# Patient Record
Sex: Female | Born: 1980 | Race: White | Hispanic: No | Marital: Married | Smoking: Never smoker
Health system: Southern US, Community
[De-identification: ages and names within clinical notes are randomized; demographics above are authoritative.]

## PROBLEM LIST (undated history)

## (undated) DIAGNOSIS — G43909 Migraine, unspecified, not intractable, without status migrainosus: Secondary | ICD-10-CM

## (undated) HISTORY — PX: TUBAL LIGATION: SHX77

---
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
## 2004-07-21 ENCOUNTER — Emergency Department: Payer: Self-pay | Admitting: Emergency Medicine

## 2004-12-01 ENCOUNTER — Emergency Department (HOSPITAL_COMMUNITY): Admission: EM | Admit: 2004-12-01 | Discharge: 2004-12-02 | Payer: Self-pay | Admitting: Emergency Medicine

## 2004-12-12 ENCOUNTER — Inpatient Hospital Stay (HOSPITAL_COMMUNITY): Admission: AD | Admit: 2004-12-12 | Discharge: 2004-12-12 | Payer: Self-pay | Admitting: *Deleted

## 2005-02-15 ENCOUNTER — Ambulatory Visit (HOSPITAL_COMMUNITY): Admission: RE | Admit: 2005-02-15 | Discharge: 2005-02-15 | Payer: Self-pay | Admitting: *Deleted

## 2005-03-19 ENCOUNTER — Ambulatory Visit (HOSPITAL_COMMUNITY): Admission: RE | Admit: 2005-03-19 | Discharge: 2005-03-19 | Payer: Self-pay | Admitting: Obstetrics and Gynecology

## 2005-04-24 ENCOUNTER — Inpatient Hospital Stay (HOSPITAL_COMMUNITY): Admission: AD | Admit: 2005-04-24 | Discharge: 2005-04-24 | Payer: Self-pay | Admitting: Obstetrics and Gynecology

## 2005-04-24 ENCOUNTER — Ambulatory Visit: Payer: Self-pay | Admitting: *Deleted

## 2005-05-30 ENCOUNTER — Ambulatory Visit: Payer: Self-pay | Admitting: *Deleted

## 2005-05-30 ENCOUNTER — Inpatient Hospital Stay (HOSPITAL_COMMUNITY): Admission: AD | Admit: 2005-05-30 | Discharge: 2005-05-31 | Payer: Self-pay | Admitting: Family Medicine

## 2005-07-01 ENCOUNTER — Ambulatory Visit: Payer: Self-pay | Admitting: Certified Nurse Midwife

## 2005-07-01 ENCOUNTER — Inpatient Hospital Stay (HOSPITAL_COMMUNITY): Admission: AD | Admit: 2005-07-01 | Discharge: 2005-07-02 | Payer: Self-pay | Admitting: *Deleted

## 2005-07-03 ENCOUNTER — Inpatient Hospital Stay (HOSPITAL_COMMUNITY): Admission: AD | Admit: 2005-07-03 | Discharge: 2005-07-03 | Payer: Self-pay | Admitting: Anesthesiology

## 2005-07-03 ENCOUNTER — Ambulatory Visit: Payer: Self-pay | Admitting: *Deleted

## 2005-07-05 ENCOUNTER — Ambulatory Visit: Payer: Self-pay | Admitting: *Deleted

## 2005-07-05 ENCOUNTER — Ambulatory Visit (HOSPITAL_COMMUNITY): Admission: RE | Admit: 2005-07-05 | Discharge: 2005-07-05 | Payer: Self-pay | Admitting: *Deleted

## 2005-07-07 ENCOUNTER — Ambulatory Visit: Payer: Self-pay | Admitting: *Deleted

## 2005-07-07 ENCOUNTER — Inpatient Hospital Stay (HOSPITAL_COMMUNITY): Admission: AD | Admit: 2005-07-07 | Discharge: 2005-07-07 | Payer: Self-pay | Admitting: Family Medicine

## 2005-07-12 ENCOUNTER — Inpatient Hospital Stay (HOSPITAL_COMMUNITY): Admission: AD | Admit: 2005-07-12 | Discharge: 2005-07-16 | Payer: Self-pay | Admitting: *Deleted

## 2005-07-12 ENCOUNTER — Ambulatory Visit: Payer: Self-pay | Admitting: *Deleted

## 2006-12-04 ENCOUNTER — Emergency Department (HOSPITAL_COMMUNITY): Admission: EM | Admit: 2006-12-04 | Discharge: 2006-12-04 | Payer: Self-pay | Admitting: Family Medicine

## 2009-04-10 ENCOUNTER — Inpatient Hospital Stay (HOSPITAL_COMMUNITY): Admission: AD | Admit: 2009-04-10 | Discharge: 2009-04-10 | Payer: Self-pay | Admitting: Obstetrics and Gynecology

## 2009-04-18 ENCOUNTER — Other Ambulatory Visit: Payer: Self-pay | Admitting: Obstetrics & Gynecology

## 2009-04-18 ENCOUNTER — Inpatient Hospital Stay (HOSPITAL_COMMUNITY): Admission: RE | Admit: 2009-04-18 | Discharge: 2009-04-20 | Payer: Self-pay | Admitting: Obstetrics & Gynecology

## 2009-05-29 ENCOUNTER — Ambulatory Visit (HOSPITAL_COMMUNITY): Admission: RE | Admit: 2009-05-29 | Discharge: 2009-05-29 | Payer: Self-pay | Admitting: Obstetrics & Gynecology

## 2011-01-12 LAB — CBC
HCT: 39.7 % (ref 36.0–46.0)
Hemoglobin: 13.8 g/dL (ref 12.0–15.0)
MCHC: 34.6 g/dL (ref 30.0–36.0)
MCV: 89.5 fL (ref 78.0–100.0)
Platelets: 205 10*3/uL (ref 150–400)
RBC: 4.44 MIL/uL (ref 3.87–5.11)
RDW: 13 % (ref 11.5–15.5)
WBC: 7.3 10*3/uL (ref 4.0–10.5)

## 2011-01-12 LAB — PREGNANCY, URINE: Preg Test, Ur: NEGATIVE

## 2011-01-13 LAB — CBC
HCT: 35.8 % — ABNORMAL LOW (ref 36.0–46.0)
HCT: 39.2 % (ref 36.0–46.0)
Hemoglobin: 12.4 g/dL (ref 12.0–15.0)
Hemoglobin: 13.8 g/dL (ref 12.0–15.0)
MCHC: 34.5 g/dL (ref 30.0–36.0)
MCHC: 35.2 g/dL (ref 30.0–36.0)
MCV: 91.3 fL (ref 78.0–100.0)
MCV: 91.5 fL (ref 78.0–100.0)
Platelets: 130 10*3/uL — ABNORMAL LOW (ref 150–400)
Platelets: 131 10*3/uL — ABNORMAL LOW (ref 150–400)
RBC: 3.91 MIL/uL (ref 3.87–5.11)
RBC: 4.3 MIL/uL (ref 3.87–5.11)
RDW: 14.3 % (ref 11.5–15.5)
RDW: 14.8 % (ref 11.5–15.5)
WBC: 8.1 10*3/uL (ref 4.0–10.5)
WBC: 9.1 10*3/uL (ref 4.0–10.5)

## 2011-01-13 LAB — URINE CULTURE: Colony Count: 35000

## 2011-01-13 LAB — URINALYSIS, ROUTINE W REFLEX MICROSCOPIC
Bilirubin Urine: NEGATIVE
Glucose, UA: NEGATIVE mg/dL
Hgb urine dipstick: NEGATIVE
Ketones, ur: 15 mg/dL — AB
Nitrite: NEGATIVE
Protein, ur: NEGATIVE mg/dL
Specific Gravity, Urine: 1.02 (ref 1.005–1.030)
Urobilinogen, UA: 0.2 mg/dL (ref 0.0–1.0)
pH: 5.5 (ref 5.0–8.0)

## 2011-01-13 LAB — CCBB MATERNAL DONOR DRAW

## 2011-01-13 LAB — RPR: RPR Ser Ql: NONREACTIVE

## 2011-02-19 NOTE — Op Note (Signed)
NAMEDANNELL, RACZKOWSKI NO.:  [id number]   MEDICAL RECORD NO.:  [id number]          PATIENT TYPE:  AMB   LOCATION:  SDC                           FACILITY:  [facility]   PHYSICIAN:  Ilda Mori, M.D.   DATE OF BIRTH:  05/28/1981   DATE OF PROCEDURE:  05/29/2009  DATE OF DISCHARGE:                               OPERATIVE REPORT   PREOPERATIVE DIAGNOSIS:  Voluntary sterilization.   POSTOPERATIVE DIAGNOSIS:  Voluntary sterilization.   PROCEDURE:  Laparoscopic-bilateral tubal cautery for sterilization.   SURGEON:  Ilda Mori, MD   ANESTHESIA:  General endotracheal.   ESTIMATED BLOOD LOSS:  Minimal.   FINDINGS:  The uterus was normal size, shape, and contour on palpation  and on laparoscopy.  The adnexa were normal without adhesions or  inflammation.  The upper abdomen was explored and it was normal.  The  appendix appeared normal.   INDICATIONS:  This is a 30 year old gravida 2, para 2 female who is 6  weeks status post spontaneous vaginal delivery, who requested permanent  sterilization, failure rate of 2:1000 was discussed with the patient as  well as the fact that sterilization was a permanent procedure in  addition, alternative forms of non-permanent birth control was discussed  with the patient prior to proceeding.   DESCRIPTION OF PROCEDURE:  The patient was taken to the operating and  placed in a supine position.  General endotracheal anesthesia was  induced.  She was then placed in dorsal lithotomy position.  The  abdomen, perineum, and vagina were prepped and draped in sterile  fashion.  The bladder was catheterized.  A Hulka tenaculum was placed  through the endocervical canal and affixed the anterior lip of the  cervix.  Surgeon regowned and gloved.  An incision was made in the umbilicus and  suprapubic area.  A Veress needle was introduced into the peritoneal  cavity and pneumoperitoneum was created.  A 5-mm trocar was introduced  and a 5-mm  laparoscope was placed.  Under direct visualization,  accessory instruments were placed through a suprapubic stab wound  through a 5-mm port.  The Kleppinger forceps were then placed through  the accessory port and the left fallopian tube was grasped at the  isthmic ampullary junction and cauterized along a 4-cm length.  The  identical procedure was then carried out on the contralateral tube.  At  this point, the procedure was terminated.  The  instruments were removed.  The gas was allowed to escape.  The trocars  were removed and the incisions were closed with Dermabond adhesive.  The  patient tolerated the procedure well and left the operating room in good  condition.      Ilda Mori, M.D.  Electronically Signed     RK/MEDQ  D:  05/29/2009  T:  05/29/2009  Job:  [id number]

## 2012-03-27 ENCOUNTER — Encounter (INDEPENDENT_AMBULATORY_CARE_PROVIDER_SITE_OTHER): Payer: BC Managed Care – PPO | Admitting: Ophthalmology

## 2012-03-27 DIAGNOSIS — H33109 Unspecified retinoschisis, unspecified eye: Secondary | ICD-10-CM

## 2012-03-27 DIAGNOSIS — H209 Unspecified iridocyclitis: Secondary | ICD-10-CM

## 2012-03-27 DIAGNOSIS — H53009 Unspecified amblyopia, unspecified eye: Secondary | ICD-10-CM

## 2012-03-27 DIAGNOSIS — H31009 Unspecified chorioretinal scars, unspecified eye: Secondary | ICD-10-CM

## 2012-03-27 DIAGNOSIS — H25049 Posterior subcapsular polar age-related cataract, unspecified eye: Secondary | ICD-10-CM

## 2012-03-27 DIAGNOSIS — H43819 Vitreous degeneration, unspecified eye: Secondary | ICD-10-CM

## 2012-05-05 ENCOUNTER — Encounter (INDEPENDENT_AMBULATORY_CARE_PROVIDER_SITE_OTHER): Payer: BC Managed Care – PPO | Admitting: Ophthalmology

## 2012-05-05 DIAGNOSIS — H53009 Unspecified amblyopia, unspecified eye: Secondary | ICD-10-CM

## 2012-05-05 DIAGNOSIS — H43819 Vitreous degeneration, unspecified eye: Secondary | ICD-10-CM

## 2012-05-05 DIAGNOSIS — H25049 Posterior subcapsular polar age-related cataract, unspecified eye: Secondary | ICD-10-CM

## 2012-05-05 DIAGNOSIS — H33109 Unspecified retinoschisis, unspecified eye: Secondary | ICD-10-CM

## 2012-08-10 ENCOUNTER — Encounter (INDEPENDENT_AMBULATORY_CARE_PROVIDER_SITE_OTHER): Payer: BC Managed Care – PPO | Admitting: Ophthalmology

## 2014-12-06 ENCOUNTER — Encounter (HOSPITAL_COMMUNITY): Payer: Self-pay | Admitting: Emergency Medicine

## 2014-12-06 ENCOUNTER — Emergency Department (HOSPITAL_COMMUNITY): Payer: Self-pay

## 2014-12-06 ENCOUNTER — Emergency Department (HOSPITAL_COMMUNITY)
Admission: EM | Admit: 2014-12-06 | Discharge: 2014-12-06 | Disposition: A | Discharge status: Home / Self Care | Payer: Self-pay | Attending: Emergency Medicine | Admitting: Emergency Medicine

## 2014-12-06 DIAGNOSIS — R112 Nausea with vomiting, unspecified: Secondary | ICD-10-CM | POA: Insufficient documentation

## 2014-12-06 DIAGNOSIS — R519 Headache, unspecified: Secondary | ICD-10-CM

## 2014-12-06 DIAGNOSIS — R51 Headache: Secondary | ICD-10-CM | POA: Insufficient documentation

## 2014-12-06 DIAGNOSIS — M542 Cervicalgia: Secondary | ICD-10-CM | POA: Insufficient documentation

## 2014-12-06 DIAGNOSIS — H53149 Visual discomfort, unspecified: Secondary | ICD-10-CM | POA: Insufficient documentation

## 2014-12-06 DIAGNOSIS — Z8679 Personal history of other diseases of the circulatory system: Secondary | ICD-10-CM | POA: Insufficient documentation

## 2014-12-06 DIAGNOSIS — R42 Dizziness and giddiness: Secondary | ICD-10-CM | POA: Insufficient documentation

## 2014-12-06 DIAGNOSIS — Z3202 Encounter for pregnancy test, result negative: Secondary | ICD-10-CM | POA: Insufficient documentation

## 2014-12-06 DIAGNOSIS — Z880 Allergy status to penicillin: Secondary | ICD-10-CM | POA: Insufficient documentation

## 2014-12-06 HISTORY — DX: Migraine, unspecified, not intractable, without status migrainosus: G43.909

## 2014-12-06 LAB — URINALYSIS, ROUTINE W REFLEX MICROSCOPIC
Bilirubin Urine: NEGATIVE
Glucose, UA: NEGATIVE mg/dL
Hgb urine dipstick: NEGATIVE
Ketones, ur: 15 mg/dL — AB
Leukocytes, UA: NEGATIVE
Nitrite: NEGATIVE
Protein, ur: NEGATIVE mg/dL
Specific Gravity, Urine: 1.014 (ref 1.005–1.030)
Urobilinogen, UA: 0.2 mg/dL (ref 0.0–1.0)
pH: 7 (ref 5.0–8.0)

## 2014-12-06 LAB — I-STAT CHEM 8, ED
BUN: 6 mg/dL (ref 6–23)
Calcium, Ion: 1.13 mmol/L (ref 1.12–1.23)
Chloride: 102 mmol/L (ref 96–112)
Creatinine, Ser: 0.9 mg/dL (ref 0.50–1.10)
Glucose, Bld: 127 mg/dL — ABNORMAL HIGH (ref 70–99)
HCT: 43 % (ref 36.0–46.0)
Hemoglobin: 14.6 g/dL (ref 12.0–15.0)
Potassium: 3.4 mmol/L — ABNORMAL LOW (ref 3.5–5.1)
Sodium: 140 mmol/L (ref 135–145)
TCO2: 22 mmol/L (ref 0–100)

## 2014-12-06 LAB — BASIC METABOLIC PANEL
Anion gap: 8 (ref 5–15)
BUN: 6 mg/dL (ref 6–23)
CO2: 27 mmol/L (ref 19–32)
Calcium: 8.9 mg/dL (ref 8.4–10.5)
Chloride: 104 mmol/L (ref 96–112)
Creatinine, Ser: 0.89 mg/dL (ref 0.50–1.10)
GFR calc Af Amer: >90 mL/min (ref >90)
GFR calc non Af Amer: 85 mL/min — ABNORMAL LOW (ref >90)
Glucose, Bld: 131 mg/dL — ABNORMAL HIGH (ref 70–99)
Potassium: 3.5 mmol/L (ref 3.5–5.1)
Sodium: 139 mmol/L (ref 135–145)

## 2014-12-06 LAB — DIFFERENTIAL
Basophils Absolute: 0 10*3/uL (ref 0.0–0.1)
Basophils Relative: 0 % (ref 0–1)
Eosinophils Absolute: 0.1 10*3/uL (ref 0.0–0.7)
Eosinophils Relative: 1 % (ref 0–5)
Lymphocytes Relative: 13 % (ref 12–46)
Lymphs Abs: 1.6 10*3/uL (ref 0.7–4.0)
Monocytes Absolute: 0.4 10*3/uL (ref 0.1–1.0)
Monocytes Relative: 4 % (ref 3–12)
Neutro Abs: 10 10*3/uL — ABNORMAL HIGH (ref 1.7–7.7)
Neutrophils Relative %: 82 % — ABNORMAL HIGH (ref 43–77)

## 2014-12-06 LAB — CBC
HCT: 41.1 % (ref 36.0–46.0)
Hemoglobin: 14.3 g/dL (ref 12.0–15.0)
MCH: 29.7 pg (ref 26.0–34.0)
MCHC: 34.8 g/dL (ref 30.0–36.0)
MCV: 85.4 fL (ref 78.0–100.0)
Platelets: 250 10*3/uL (ref 150–400)
RBC: 4.81 MIL/uL (ref 3.87–5.11)
RDW: 13.3 % (ref 11.5–15.5)
WBC: 12.4 10*3/uL — ABNORMAL HIGH (ref 4.0–10.5)

## 2014-12-06 LAB — POC URINE PREG, ED: Preg Test, Ur: NEGATIVE

## 2014-12-06 MED ORDER — SODIUM CHLORIDE 0.9 % IV BOLUS (SEPSIS)
1000.0000 mL | Freq: Once | INTRAVENOUS | Status: AC
  Administered 2014-12-06: 1000 mL via INTRAVENOUS

## 2014-12-06 MED ORDER — DIPHENHYDRAMINE HCL 50 MG/ML IJ SOLN
25.0000 mg | Freq: Once | INTRAMUSCULAR | Status: AC
  Administered 2014-12-06: 25 mg via INTRAVENOUS
  Filled 2014-12-06: qty 1

## 2014-12-06 MED ORDER — NAPROXEN 500 MG PO TABS: 500.0000 mg | ORAL_TABLET | Freq: Two times a day (BID) | ORAL | Status: DC | PRN

## 2014-12-06 MED ORDER — METOCLOPRAMIDE HCL 5 MG/ML IJ SOLN
10.0000 mg | Freq: Once | INTRAMUSCULAR | Status: AC
  Administered 2014-12-06: 10 mg via INTRAVENOUS
  Filled 2014-12-06: qty 2

## 2014-12-06 MED ORDER — ONDANSETRON 4 MG PO TBDP
8.0000 mg | ORAL_TABLET | Freq: Once | ORAL | Status: AC
  Administered 2014-12-06: 8 mg via ORAL

## 2014-12-06 MED ORDER — MORPHINE SULFATE 4 MG/ML IJ SOLN
4.0000 mg | Freq: Once | INTRAMUSCULAR | Status: AC
  Administered 2014-12-06: 4 mg via INTRAVENOUS
  Filled 2014-12-06: qty 1

## 2014-12-06 MED ORDER — METOCLOPRAMIDE HCL 10 MG PO TABS: 10.0000 mg | ORAL_TABLET | Freq: Four times a day (QID) | ORAL | Status: AC | PRN

## 2014-12-06 MED ORDER — DIPHENHYDRAMINE HCL 25 MG PO TABS: 25.0000 mg | ORAL_TABLET | Freq: Four times a day (QID) | ORAL | Status: AC | PRN

## 2014-12-06 NOTE — ED Notes (Signed)
Ct called to report patient is ready, Annabelle HarmanDana acknowledges.

## 2014-12-06 NOTE — ED Notes (Signed)
Patient hesitant to remove ear piercings for CT scan, called CT tech to report and she came down to review.

## 2014-12-06 NOTE — ED Notes (Addendum)
Patient with sharp pains in right side of head.  Patient states she has been vomiting with the pain.  Patient states that the pains come in waves and continues on.  Patient very tearful in triage.  Patient states that she does have some neck pain from clenching when the pain comes.  Patient continues with the nausea, which comes in waves with the headache.  Patient states that this pain is nothing like her migraines, much worse.

## 2014-12-06 NOTE — ED Notes (Signed)
Patient given a ginger ale as PO challenge.

## 2014-12-06 NOTE — ED Notes (Signed)
France RavensMercedes, Pa-C, at the bedside.

## 2014-12-06 NOTE — ED Provider Notes (Signed)
CSN: [id number]     Arrival date & time 12/06/14  1924 History   First MD Initiated Contact with Patient 12/06/14 1958     Chief Complaint  Patient presents with  . Headache  . Emesis     (Consider location/radiation/quality/duration/timing/severity/associated sxs/prior Treatment) HPI Comments: Shelby Ward is a 34 y.o. female with a PMHx of migraines, who presents to the ED with complaints of sudden onset right-sided headache that began at 5:30 PM. She reports the pain is 10/10 throbbing with intermittent stabbing pain, constant but waxing and waning in severity, which intermittently radiates to her neck and over to the left side of her head, worse with lights and sounds, and unrelieved with Advil due to her throwing up prior to arrival. She endorses associated nausea and vomiting with 7 episodes of nonbloody nonbilious emesis prior to arrival (threw up zofran given in triage), as well as photophobia and lightheadedness with standing. She reports that this headache is somewhat similar to her migraines except that the location is slightly different and she usually has preceding symptoms to signal she is getting a headache. She reports that this is not the worse headache of her life she has had more severe headaches in the past. She reports that last week she had a URI and is having some residual sinus congestion and clear rhinorrhea but these are improving since last week. She denies any fevers, chills, chest pain, shortness breath, ear pain or drainage, tinnitus, hearing loss, sore throat, abdominal pain, diarrhea, constipation, speech, melena, hematochezia, dysuria, hematuria, vaginal bleeding or discharge, numbness tingling, weakness, dizziness, vision changes, syncope, neck stiffness, or rashes.  Last menstrual period was 11/26/14. Got tragus piercing last week.  Patient is a 34 y.o. female presenting with headaches and vomiting. The history is provided by the patient. No language interpreter was  used.  Headache Pain location:  R temporal Quality:  Stabbing (throbbing and occasionally stabbing) Radiates to:  L neck and R neck (and generalized head) Severity currently:  10/10 Severity at highest:  10/10 Onset quality:  Sudden Duration:  3 hours Timing:  Constant Progression:  Waxing and waning Chronicity:  Recurrent Similar to prior headaches: yes (similar in the way it feels, different in location, not the worse of her life)   Context: bright light and loud noise   Relieved by:  Nothing Worsened by:  Light and sound Ineffective treatments:  NSAIDs (advil attempted but vomited it up) Associated symptoms: nausea, neck pain, photophobia, sinus pressure (resolving URI from last week) and vomiting   Associated symptoms: no abdominal pain, no back pain, no blurred vision, no congestion, no cough, no diarrhea, no dizziness, no ear pain, no eye pain, no facial pain, no fever, no focal weakness, no hearing loss, no loss of balance, no myalgias, no near-syncope, no neck stiffness, no numbness, no paresthesias, no sore throat, no syncope, no tingling, no URI, no visual change and no weakness   Emesis Associated symptoms: headaches   Associated symptoms: no abdominal pain, no arthralgias, no chills, no diarrhea, no myalgias, no sore throat and no URI     Past Medical History  Diagnosis Date  . Migraine    Past Surgical History  Procedure Laterality Date  . Tubal ligation     History reviewed. No pertinent family history. History  Substance Use Topics  . Smoking status: Never Smoker   . Smokeless tobacco: Not on file  . Alcohol Use: No   OB History    No data available  Review of Systems  Constitutional: Negative for fever and chills.  HENT: Positive for rhinorrhea (clear) and sinus pressure (resolving URI from last week). Negative for congestion, ear discharge, ear pain, hearing loss, sore throat and tinnitus.   Eyes: Positive for photophobia. Negative for blurred vision,  pain, discharge and visual disturbance.  Respiratory: Negative for cough and shortness of breath.   Cardiovascular: Negative for chest pain, syncope and near-syncope.  Gastrointestinal: Positive for nausea and vomiting. Negative for abdominal pain, diarrhea, constipation and blood in stool.  Genitourinary: Negative for dysuria, frequency, hematuria, flank pain, vaginal bleeding and vaginal discharge.  Musculoskeletal: Positive for neck pain. Negative for myalgias, back pain, arthralgias and neck stiffness.  Skin: Negative for rash.  Neurological: Positive for light-headedness (with standing) and headaches. Negative for dizziness, focal weakness, syncope, facial asymmetry, weakness, numbness, paresthesias and loss of balance.  Psychiatric/Behavioral: Negative for confusion.   10 Systems reviewed and are negative for acute change except as noted in the HPI.    Allergies  Penicillins  Home Medications   Prior to Admission medications   Not on File   BP 152/103 mmHg  Pulse 64  Temp(Src) 98.2 F (36.8 C) (Oral)  Resp 20  Ht 5\' 9"  (1.753 m)  Wt 280 lb (127.007 kg)  BMI 41.33 kg/m2  SpO2 96%  LMP 11/26/2014 (Approximate) Physical Exam  Constitutional: She is oriented to person, place, and time. Vital signs are normal. She appears well-developed and well-nourished.  Non-toxic appearance. She appears distressed.  Afebrile, nontoxic, tearful and slightly anxious, appears uncomfortable. BP 152/103 likely from pt discomfort since it came down to 122/96 upon recheck  HENT:  Head: Normocephalic and atraumatic.  Right Ear: Hearing, tympanic membrane, external ear and ear canal normal.  Left Ear: Hearing, tympanic membrane, external ear and ear canal normal.  Nose: Mucosal edema (very mild) and rhinorrhea (clear) present. Right sinus exhibits maxillary sinus tenderness. Right sinus exhibits no frontal sinus tenderness. Left sinus exhibits maxillary sinus tenderness. Left sinus exhibits no  frontal sinus tenderness.  Mouth/Throat: Uvula is midline, oropharynx is clear and moist and mucous membranes are normal.  Slight mucosal edema of b/l nasal turbinates with clear rhinorrhea and b/l maxillary sinus TTP Oropharynx clear Ears clear /AT  Eyes: Conjunctivae and EOM are normal. Pupils are equal, round, and reactive to light. Right eye exhibits no discharge. Left eye exhibits no discharge.  PERRL, EOMI, no nystagmus, no visual deficits  Neck: Normal range of motion. Neck supple. Muscular tenderness present. No spinous process tenderness present. No rigidity. Normal range of motion present.    FROM intact without spinous process TTP, no bony stepoffs or deformities, slight b/l paraspinous muscle tenderness with some muscle spasms although pt reports that palpation of the area "feels good". No rigidity or meningeal signs.  Cardiovascular: Normal rate, regular rhythm, normal heart sounds and intact distal pulses.  Exam reveals no gallop and no friction rub.   No murmur heard. Pulmonary/Chest: Effort normal and breath sounds normal. No respiratory distress. She has no decreased breath sounds. She has no wheezes. She has no rhonchi. She has no rales.  Abdominal: Soft. Normal appearance and bowel sounds are normal. She exhibits no distension. There is no tenderness. There is no rigidity, no rebound, no guarding, no CVA tenderness, no tenderness at McBurney's point and negative Murphy's sign.  Musculoskeletal: Normal range of motion.  MAE x4 All spinal levels nonTTP without bony stepoffs or deformities Strength 5/5 and sensation grossly intact in all extremities Distal pulses  intact Gait steady  Neurological: She is alert and oriented to person, place, and time. She has normal strength. No cranial nerve deficit or sensory deficit. She displays a negative Romberg sign. Coordination and gait normal. GCS eye subscore is 4. GCS verbal subscore is 5. GCS motor subscore is 6.  CN 2-12 grossly  intact A&O x4 GCS 15 Sensation and strength intact Gait nonataxic including with tandem walking Coordination with finger-to-nose WNL Neg romberg, neg pronator drift   Skin: Skin is warm, dry and intact. No rash noted.  Psychiatric: She has a normal mood and affect.  Nursing note and vitals reviewed.   ED Course  Procedures (including critical care time) Labs Review Labs Reviewed  BASIC METABOLIC PANEL - Abnormal; Notable for the following:    Glucose, Bld 131 (*)    GFR calc non Af Amer 85 (*)    All other components within normal limits  CBC - Abnormal; Notable for the following:    WBC 12.4 (*)    All other components within normal limits  DIFFERENTIAL - Abnormal; Notable for the following:    Neutrophils Relative % 82 (*)    Neutro Abs 10.0 (*)    All other components within normal limits  URINALYSIS, ROUTINE W REFLEX MICROSCOPIC - Abnormal; Notable for the following:    Ketones, ur 15 (*)    All other components within normal limits  I-STAT CHEM 8, ED - Abnormal; Notable for the following:    Potassium 3.4 (*)    Glucose, Bld 127 (*)    All other components within normal limits  POC URINE PREG, ED    Imaging Review Ct Head Wo Contrast  12/06/2014   CLINICAL DATA:  Sharp pains in the right-sided of head with vomiting.  EXAM: CT HEAD WITHOUT CONTRAST  TECHNIQUE: Contiguous axial images were obtained from the base of the skull through the vertex without intravenous contrast.  COMPARISON:  None.  FINDINGS: Skull and Sinuses:Negative for fracture or destructive process. The mastoids, middle ears, and imaged paranasal sinuses are clear.  Orbits: No acute abnormality.  Brain: No evidence of acute infarction, hemorrhage, hydrocephalus, or mass lesion/mass effect. 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  IMPRESSION: Negative head CT.   Electronically Signed   By: Marnee SpringJonathon  Watts M.D.    On: 12/06/2014 22:43     EKG Interpretation None      MDM   Final diagnoses:  Headache  Non-intractable vomiting with nausea, vomiting of unspecified type    34 y.o. female with sudden onset HA at 5:30pm accompanied with N/V. Had URI last week, still having some rhinorrhea. Mild maxillary sinus tenderness and clear rhinorrhea. Claims this is slightly different than migraines in that it's a different location but she denies that this is the most severe headache of her life. Given sudden onset, will get CT head to r/o SAH. Nonfocal neuro exam and no meningeal signs, doubt meningitis, CVA/TIA, or benign intracranial hypertension although this is on the differential since pt fits in the demographic of patients with this diagnosis. No vision changes which makes it slightly less likely. Will give modified migraine cocktail and reassess shortly.   9:45 PM BMP WNL. CBC showing leukocytosis of 12.4, neutrophilic predominance which does raise concern for meningitis although pt is afebrile and without meningismus therefore this is surprising. CT not yet done, had to remove tragus piercing first, successfully removed these and placed angiocath  tubing to hold piercing site open. HA improved down to 1/10 but pt states it's still R sided and just slightly present. No ongoing N/V. Will await CT.   11:11 PM CT neg. Upreg neg. U/A clear. Pain improved, nausea improved, tolerating PO well. No longer having neck spasms, I believe those were likely from her tensing up in pain. VS remain stable. Given that I doubt meningitis in this pt, will not proceed with LP, but strict return precautions discussed. Will have her f/up with PCP. Will send home with reglan/benadryl and naprosyn. I explained the diagnosis and have given explicit precautions to return to the ER including for any other new or worsening symptoms. The patient understands and accepts the medical plan as it's been dictated and I have answered their  questions. Discharge instructions concerning home care and prescriptions have been given. The patient is STABLE and is discharged to home in good condition.  BP 128/83 mmHg  Pulse 91  Temp(Src) 98.2 F (36.8 C) (Oral)  Resp 12  Ht  (1.753 m)  Wt 280 lb (127.007 kg)  BMI 41.33 kg/m2  SpO2 99%  LMP 11/26/2014 (Approximate)  Meds ordered this encounter  Medications  . ondansetron (ZOFRAN-ODT) disintegrating tablet 8 mg    Sig:   . sodium chloride 0.9 % bolus 1,000 mL    Sig:   . morphine 4 MG/ML injection 4 mg    Sig:   . metoCLOPramide (REGLAN) injection 10 mg    Sig:   . diphenhydrAMINE (BENADRYL) injection 25 mg    Sig:   . metoCLOPramide (REGLAN) 10 MG tablet    Sig: Take 1 tablet (10 mg total) by mouth every 6 (six) hours as needed for nausea (nausea/headache).    Dispense:  12 tablet    Refill:  0    Order Specific Question:  Supervising Provider    Answer:  Eber Hong D [[id number]]  . diphenhydrAMINE (BENADRYL) 25 MG tablet    Sig: Take 1 tablet (25 mg total) by mouth every 6 (six) hours as needed (nausea/headaches with Reglan).    Dispense:  12 tablet    Refill:  0    Order Specific Question:  Supervising Provider    Answer:  Eber Hong D [[id number]]  . naproxen (NAPROSYN) 500 MG tablet    Sig: Take 1 tablet (500 mg total) by mouth 2 (two) times daily as needed for mild pain, moderate pain or headache (TAKE WITH MEALS.).    Dispense:  20 tablet    Refill:  0    Order Specific Question:  Supervising Provider    Answer:  Vida Roller [address] Page, PA-C 12/06/14 [id number]  Lyanne Co, MD 12/07/14 [phone]

## 2014-12-06 NOTE — ED Notes (Signed)
Patient changing into gown 

## 2014-12-06 NOTE — Discharge Instructions (Signed)
Use reglan and benadryl as needed for nausea/headache, as well as tylenol or naprosyn for headache. Stay well hydrated. Follow up with your regular doctor in 1 week for ongoing management of your migraines. Return to the ER for changes or worsening symptoms, including but not limited to fevers, neck stiffness, vision changes, or confusion.     Recurrent Migraine Headache A migraine headache is very bad, throbbing pain on one or both sides of your head. Recurrent migraines keep coming back. Talk to your doctor about what things may bring on (trigger) your migraine headaches. HOME CARE  Only take medicines as told by your doctor.  Lie down in a dark, quiet room when you have a migraine.  Keep a journal to find out if certain things bring on migraine headaches. For example, write down:  What you eat and drink.  How much sleep you get.  Any change to your diet or medicines.  Lessen how much alcohol you drink.  Quit smoking if you smoke.  Get enough sleep.  Lessen any stress in your life.  Keep lights dim if bright lights bother you or make your migraines worse. GET HELP IF:  Medicine does not help your migraines.  Your pain keeps coming back.  You have a fever. GET HELP RIGHT AWAY IF:   Your migraine becomes really bad.  You have a stiff neck.  You have trouble seeing.  Your muscles are weak, or you lose muscle control.  You lose your balance or have trouble walking.  You feel like you will pass out (faint), or you pass out.  You have really bad symptoms that are different than your first symptoms. MAKE SURE YOU:   Understand these instructions.  Will watch your condition.  Will get help right away if you are not doing well or get worse. Document Released: 07/02/2008 Document Revised: 09/28/2013 Document Reviewed: 05/31/2013 [facility] Patient Information 2015 [employer] [place]. This information is not intended to replace advice given to you by your health care  provider. Make sure you discuss any questions you have with your health care provider.  Nausea and Vomiting Nausea is a sick feeling that often comes before throwing up (vomiting). Vomiting is a reflex where stomach contents come out of your mouth. Vomiting can cause severe loss of body fluids (dehydration). Children and elderly adults can become dehydrated quickly, especially if they also have diarrhea. Nausea and vomiting are symptoms of a condition or disease. It is important to find the cause of your symptoms. CAUSES   Direct irritation of the stomach lining. This irritation can result from increased acid production (gastroesophageal reflux disease), infection, food poisoning, taking certain medicines (such as nonsteroidal anti-inflammatory drugs), alcohol use, or tobacco use.  Signals from the brain.These signals could be caused by a headache, heat exposure, an inner ear disturbance, increased pressure in the brain from injury, infection, a tumor, or a concussion, pain, emotional stimulus, or metabolic problems.  An obstruction in the gastrointestinal tract (bowel obstruction).  Illnesses such as diabetes, hepatitis, gallbladder problems, appendicitis, kidney problems, cancer, sepsis, atypical symptoms of a heart attack, or eating disorders.  Medical treatments such as chemotherapy and radiation.  Receiving medicine that makes you sleep (general anesthetic) during surgery. DIAGNOSIS Your caregiver may ask for tests to be done if the problems do not improve after a few days. Tests may also be done if symptoms are severe or if the reason for the nausea and vomiting is not clear. Tests may include:  Urine tests.  Blood tests.  Stool tests.  Cultures (to look for evidence of infection).  X-rays or other imaging studies. Test results can help your caregiver make decisions about treatment or the need for additional tests. TREATMENT You need to stay well hydrated. Drink frequently but  in small amounts.You may wish to drink water, sports drinks, clear broth, or eat frozen ice pops or gelatin dessert to help stay hydrated.When you eat, eating slowly may help prevent nausea.There are also some antinausea medicines that may help prevent nausea. HOME CARE INSTRUCTIONS   Take all medicine as directed by your caregiver.  If you do not have an appetite, do not force yourself to eat. However, you must continue to drink fluids.  If you have an appetite, eat a normal diet unless your caregiver tells you differently.  Eat a variety of complex carbohydrates (rice, wheat, potatoes, bread), lean meats, yogurt, fruits, and vegetables.  Avoid high-fat foods because they are more difficult to digest.  Drink enough water and fluids to keep your urine clear or pale yellow.  If you are dehydrated, ask your caregiver for specific rehydration instructions. Signs of dehydration may include:  Severe thirst.  Dry lips and mouth.  Dizziness.  Dark urine.  Decreasing urine frequency and amount.  Confusion.  Rapid breathing or pulse. SEEK IMMEDIATE MEDICAL CARE IF:   You have blood or brown flecks (like coffee grounds) in your vomit.  You have black or bloody stools.  You have a severe headache or stiff neck.  You are confused.  You have severe abdominal pain.  You have chest pain or trouble breathing.  You do not urinate at least once every 8 hours.  You develop cold or clammy skin.  You continue to vomit for longer than 24 to 48 hours.  You have a fever. MAKE SURE YOU:   Understand these instructions.  Will watch your condition.  Will get help right away if you are not doing well or get worse. Document Released: 09/23/2005 Document Revised: 12/16/2011 Document Reviewed: 02/20/2011 [facility] Patient Information 2015 [place], [place]. This information is not intended to replace advice given to you by your health care provider. Make sure you discuss any questions you  have with your health care provider.

## 2019-03-16 ENCOUNTER — Emergency Department: Payer: Self-pay

## 2019-03-16 ENCOUNTER — Other Ambulatory Visit: Payer: Self-pay

## 2019-03-16 ENCOUNTER — Encounter: Payer: Self-pay | Admitting: Emergency Medicine

## 2019-03-16 ENCOUNTER — Emergency Department
Admission: EM | Admit: 2019-03-16 | Discharge: 2019-03-16 | Disposition: A | Discharge status: Home / Self Care | Payer: Self-pay | Attending: Student in an Organized Health Care Education/Training Program | Admitting: Student in an Organized Health Care Education/Training Program

## 2019-03-16 DIAGNOSIS — Z23 Encounter for immunization: Secondary | ICD-10-CM | POA: Insufficient documentation

## 2019-03-16 DIAGNOSIS — W231XXA Caught, crushed, jammed, or pinched between stationary objects, initial encounter: Secondary | ICD-10-CM | POA: Insufficient documentation

## 2019-03-16 DIAGNOSIS — Y929 Unspecified place or not applicable: Secondary | ICD-10-CM | POA: Insufficient documentation

## 2019-03-16 DIAGNOSIS — Y999 Unspecified external cause status: Secondary | ICD-10-CM | POA: Insufficient documentation

## 2019-03-16 DIAGNOSIS — S91115A Laceration without foreign body of left lesser toe(s) without damage to nail, initial encounter: Secondary | ICD-10-CM | POA: Insufficient documentation

## 2019-03-16 DIAGNOSIS — Y939 Activity, unspecified: Secondary | ICD-10-CM | POA: Insufficient documentation

## 2019-03-16 DIAGNOSIS — S92502B Displaced unspecified fracture of left lesser toe(s), initial encounter for open fracture: Secondary | ICD-10-CM | POA: Insufficient documentation

## 2019-03-16 MED ORDER — TRAMADOL HCL 50 MG PO TABS
50.0000 mg | ORAL_TABLET | Freq: Once | ORAL | Status: AC
  Administered 2019-03-16: 50 mg via ORAL
  Filled 2019-03-16: qty 1

## 2019-03-16 MED ORDER — TETANUS-DIPHTH-ACELL PERTUSSIS 5-2.5-18.5 LF-MCG/0.5 IM SUSP
0.5000 mL | Freq: Once | INTRAMUSCULAR | Status: AC
  Administered 2019-03-16: 0.5 mL via INTRAMUSCULAR
  Filled 2019-03-16: qty 0.5

## 2019-03-16 MED ORDER — CEPHALEXIN 500 MG PO CAPS
500.0000 mg | ORAL_CAPSULE | Freq: Once | ORAL | Status: AC
  Administered 2019-03-16: 500 mg via ORAL
  Filled 2019-03-16: qty 1

## 2019-03-16 MED ORDER — LIDOCAINE HCL (PF) 1 % IJ SOLN
5.0000 mL | Freq: Once | INTRAMUSCULAR | Status: AC
  Administered 2019-03-16: 5 mL
  Filled 2019-03-16: qty 5

## 2019-03-16 NOTE — Discharge Instructions (Addendum)
Follow-up with orthopedics for evaluation of the open leg fracture and laceration.  Take the antibiotic to prevent infection.  Pain medication as needed.  Return to the emergency department if worsening.

## 2019-03-16 NOTE — ED Triage Notes (Signed)
Pt arrived via POV with reports of dropping several cans of chicken on left foot and toes, pt reports bleeding to toes and has gauze wrapped currently.   Pt states the middle toe on left foot has laceration.

## 2019-03-16 NOTE — ED Provider Notes (Signed)
[facility] Emergency Department Provider Note  ____________________________________________   First MD Initiated Contact with Patient 03/16/19 1922     (approximate)  I have reviewed the triage vital signs and the nursing notes.   HISTORY  Chief Complaint Foot Injury    HPI Shelby Ward is a 38 y.o. female presents emergency department complaining of left foot pain.  She dropped a package of canned chicken on her foot.  She states toe began bleeding.  She thinks broke toe also.  Unsure of her last Tdap.  No other complaints.    Past Medical History:  Diagnosis Date  . Migraine     There are no active problems to display for this patient.   Past Surgical History:  Procedure Laterality Date  . TUBAL LIGATION      Prior to Admission medications   Medication Sig Start Date End Date Taking? Authorizing Provider  diphenhydrAMINE (BENADRYL) 25 MG tablet Take 1 tablet (25 mg total) by mouth every 6 (six) hours as needed (nausea/headaches with Reglan). 12/06/14   Street, [place], PA-C  metoCLOPramide (REGLAN) 10 MG tablet Take 1 tablet (10 mg total) by mouth every 6 (six) hours as needed for nausea (nausea/headache). 12/06/14   Street, [place], PA-C  naproxen (NAPROSYN) 500 MG tablet Take 1 tablet (500 mg total) by mouth 2 (two) times daily as needed for mild pain, moderate pain or headache (TAKE WITH MEALS.). 12/06/14   Street, [place], PA-C    Allergies Penicillins  No family history on file.  Social History Social History   Tobacco Use  . Smoking status: Never Smoker  . Smokeless tobacco: Never Used  Substance Use Topics  . Alcohol use: No  . Drug use: No    Review of Systems  Constitutional: No fever/chills Eyes: No visual changes. ENT: No sore throat. Respiratory: Denies cough Genitourinary: Negative for dysuria. Musculoskeletal: Negative for back pain.  Left foot and toe injury Skin: Negative for rash.     ____________________________________________   PHYSICAL EXAM:  VITAL SIGNS: ED Triage Vitals  Enc Vitals Group     BP 03/16/19 1840 (!) 132/95     Pulse Rate 03/16/19 1840 86     Resp 03/16/19 1840 19     Temp 03/16/19 1840 98.4 F (36.9 C)     Temp Source 03/16/19 1840 Oral     SpO2 03/16/19 1840 99 %     Weight 03/16/19 1840 276 lb (125.2 kg)     Height 03/16/19 1840 5\' 9"  (1.753 m)     Head Circumference --      Peak Flow --      Pain Score 03/16/19 1905 6     Pain Loc --      Pain Edu? --      Excl. in GC? --     Constitutional: Alert and oriented. Well appearing and in no acute distress. Eyes: Conjunctivae are normal.  Head: Atraumatic. Nose: No congestion/rhinnorhea. Mouth/Throat: Mucous membranes are moist.   Neck:  supple no lymphadenopathy noted Cardiovascular: Normal rate, regular rhythm.  Respiratory: Normal respiratory effort.  No retractions GU: deferred Musculoskeletal: FROM all extremities, warm and well perfused, left toe has a laceration with questionable near amputation noted at the third distal phalanx.  No foreign body noted. Neurologic:  Normal speech and language.  Skin:  Skin is warm, dry. No rash noted. Psychiatric: Mood and affect are normal. Speech and behavior are normal.  ____________________________________________   LABS (all labs ordered are  listed, but only abnormal results are displayed)  Labs Reviewed - No data to display ____________________________________________   ____________________________________________  RADIOLOGY  X-ray of the left foot shows a  fracture of the third toe distal phalanx  ____________________________________________   PROCEDURES  Procedure(s) performed:   Marland Kitchen.Marland Kitchen.Laceration Repair Date/Time: 03/16/2019 8:17 PM Performed by: Faythe GheeFisher,  W, PA-C Authorized by: Faythe GheeFisher,  W, PA-C   Consent:    Consent obtained:  Verbal   Consent given by:  Patient   Risks discussed:  Infection, pain and poor  wound healing Anesthesia (see MAR for exact dosages):    Anesthesia method:  Nerve block   Block anesthetic:  Lidocaine 1% w/o epi   Block injection procedure:  Anatomic landmarks identified, introduced needle, incremental injection, anatomic landmarks palpated and negative aspiration for blood   Block outcome:  Anesthesia achieved Laceration details:    Location:  Toe   Toe location:  L third toe   Length (cm):  1   Depth (mm):  4 Repair type:    Repair type:  Simple Pre-procedure details:    Preparation:  Patient was prepped and draped in usual sterile fashion Exploration:    Hemostasis achieved with:  Direct pressure   Wound exploration: wound explored through full range of motion     Wound extent: underlying fracture     Wound extent: no foreign bodies/material noted, no muscle damage noted and no tendon damage noted     Contaminated: no   Treatment:    Area cleansed with:  Betadine and saline   Amount of cleaning:  Standard   Irrigation solution:  Sterile saline   Irrigation method:  Syringe and tap Skin repair:    Repair method:  Sutures   Suture size:  5-0   Suture material:  Nylon   Suture technique:  Simple interrupted   Number of sutures:  2 Approximation:    Approximation:  Close Post-procedure details:    Dressing:  Non-adherent dressing and splint for protection   Patient tolerance of procedure:  Tolerated well, no immediate complications      ____________________________________________   INITIAL IMPRESSION / ASSESSMENT AND PLAN / ED COURSE  Pertinent labs & imaging results that were available during my care of the patient were reviewed by me and considered in my medical decision making (see chart for details).   38 year old female presents emergency department with complaints of left foot pain a laceration to the left third toe.  Unsure of last Tdap.  Physical exam shows the left foot to be tender and left third toe has near amputation noted.   Bleeding is controlled with pressure dressing.  X-ray of the left foot shows fracture of the left distal third toe. Tdap given in the ED  See procedure note for laceration repair.  Due to this being an open fracture, the patient was given a prescription for Keflex.  She states she has been able to tolerate Keflex in the past.  She was also given a Tdap while here in the ED.  She is to follow-up with orthopedics for recheck to evaluate the fracture.  She is to return emergency department or remove her stitches herself in approximately 10 to 14 days.  Take the antibiotic as prescribed.  She was also given pain medication/tramadol.  She is to return if any sign of infection.  Discharged in stable condition.    As part of my medical decision making, I reviewed the following data within the electronic MEDICAL RECORD NUMBER Nursing  notes reviewed and incorporated, Old chart reviewed, Radiograph reviewed x-ray left foot shows fracture of the third toe, Notes from prior ED visits and [place] Controlled Substance Database  ____________________________________________   FINAL CLINICAL IMPRESSION(S) / ED DIAGNOSES  Final diagnoses:  Open fracture of phalanx of left third toe, initial encounter      NEW MEDICATIONS STARTED DURING THIS VISIT:  New Prescriptions   No medications on file     Note:  This document was prepared using Dragon voice recognition software and may include unintentional dictation errors.    Versie Starks, PA-C 03/16/19 2021    Merlyn Lot, MD 03/16/19 2049

## 2019-03-17 NOTE — ED Notes (Signed)
Patient called asking about antibiotic rx--not at [facility].  Spoke to dr Kerman Passey and will call in cephalexin 500 mg 3 times daily for 10 days to [facility] [address].   Also called pt back.

## 2019-04-15 ENCOUNTER — Emergency Department
Admission: EM | Admit: 2019-04-15 | Discharge: 2019-04-15 | Disposition: A | Discharge status: Home / Self Care | Payer: Self-pay | Attending: Emergency Medicine | Admitting: Emergency Medicine

## 2019-04-15 ENCOUNTER — Emergency Department: Payer: Self-pay

## 2019-04-15 ENCOUNTER — Other Ambulatory Visit: Payer: Self-pay

## 2019-04-15 DIAGNOSIS — S51011A Laceration without foreign body of right elbow, initial encounter: Secondary | ICD-10-CM | POA: Insufficient documentation

## 2019-04-15 DIAGNOSIS — S99922D Unspecified injury of left foot, subsequent encounter: Secondary | ICD-10-CM

## 2019-04-15 DIAGNOSIS — S51812A Laceration without foreign body of left forearm, initial encounter: Secondary | ICD-10-CM | POA: Insufficient documentation

## 2019-04-15 DIAGNOSIS — Y999 Unspecified external cause status: Secondary | ICD-10-CM | POA: Insufficient documentation

## 2019-04-15 DIAGNOSIS — W540XXA Bitten by dog, initial encounter: Secondary | ICD-10-CM

## 2019-04-15 DIAGNOSIS — Y929 Unspecified place or not applicable: Secondary | ICD-10-CM | POA: Insufficient documentation

## 2019-04-15 DIAGNOSIS — Y9389 Activity, other specified: Secondary | ICD-10-CM | POA: Insufficient documentation

## 2019-04-15 DIAGNOSIS — M79672 Pain in left foot: Secondary | ICD-10-CM | POA: Insufficient documentation

## 2019-04-15 MED ORDER — DOXYCYCLINE HYCLATE 50 MG PO CAPS: 100.0000 mg | ORAL_CAPSULE | Freq: Two times a day (BID) | ORAL | 0 refills | Status: AC

## 2019-04-15 MED ORDER — DOXYCYCLINE HYCLATE 100 MG PO TABS
100.0000 mg | ORAL_TABLET | Freq: Once | ORAL | Status: AC
  Administered 2019-04-15: 100 mg via ORAL
  Filled 2019-04-15: qty 1

## 2019-04-15 MED ORDER — LIDOCAINE HCL (PF) 1 % IJ SOLN
15.0000 mL | Freq: Once | INTRAMUSCULAR | Status: AC
  Administered 2019-04-15: 20 mL via INTRADERMAL
  Filled 2019-04-15: qty 15

## 2019-04-15 MED ORDER — LIDOCAINE HCL (PF) 1 % IJ SOLN
INTRAMUSCULAR | Status: AC
  Filled 2019-04-15: qty 5

## 2019-04-15 MED ORDER — OXYCODONE-ACETAMINOPHEN 5-325 MG PO TABS
1.0000 | ORAL_TABLET | ORAL | Status: DC | PRN
  Administered 2019-04-15: 1 via ORAL
  Filled 2019-04-15: qty 1

## 2019-04-15 MED ORDER — OXYCODONE-ACETAMINOPHEN 5-325 MG PO TABS: 1.0000 | ORAL_TABLET | ORAL | 0 refills | Status: AC | PRN

## 2019-04-15 NOTE — Discharge Instructions (Signed)
Please take antibiotics to prevent any infection.  I have given you medication for extreme pain.  Please follow-up with primary care in 1 week to have sutures removed.  Please follow-up with podiatry for your foot.

## 2019-04-15 NOTE — ED Notes (Signed)
Areas on left forearm and right elbow were covered with a non-adherent bandage and wrapped with kling per Rolla Plate PA-C.

## 2019-04-15 NOTE — ED Provider Notes (Signed)
[facility] Emergency Department Provider Note  ____________________________________________  Time seen: Approximately 12:06 PM  I have reviewed the triage vital signs and the nursing notes.   HISTORY  Chief Complaint Animal Bite    HPI Shelby Ward is a 38 y.o. female that presents to the emergency department for evaluation of dog bite.  Patient states that her dog was trying to bite her other dog and she got "in the way."  Dog's rabies are up-to-date.  Patient's last tetanus shot was 1 month ago.  Patient would also like to have her left foot re-x-rayed.  Patient states that she had a fracture to this foot about 1 month ago.  She has been wearing the postop shoe that she was given.  She states that her dogs have stepped on this foot many times since.  She has not been able to follow-up with podiatry because she has not had the $250 for the visit.   Past Medical History:  Diagnosis Date  . Migraine     There are no active problems to display for this patient.   Past Surgical History:  Procedure Laterality Date  . TUBAL LIGATION      Prior to Admission medications   Medication Sig Start Date End Date Taking? Authorizing Provider  diphenhydrAMINE (BENADRYL) 25 MG tablet Take 1 tablet (25 mg total) by mouth every 6 (six) hours as needed (nausea/headaches with Reglan). 12/06/14   Street, [place], PA-C  doxycycline (VIBRAMYCIN) 50 MG capsule Take 2 capsules (100 mg total) by mouth 2 (two) times daily for 10 days. 04/15/19 04/25/19  Enid DerryWagner, Arihana Ambrocio, PA-C  metoCLOPramide (REGLAN) 10 MG tablet Take 1 tablet (10 mg total) by mouth every 6 (six) hours as needed for nausea (nausea/headache). 12/06/14   Street, [place], PA-C  naproxen (NAPROSYN) 500 MG tablet Take 1 tablet (500 mg total) by mouth 2 (two) times daily as needed for mild pain, moderate pain or headache (TAKE WITH MEALS.). 12/06/14   Street, [place], PA-C  oxyCODONE-acetaminophen (PERCOCET) 5-325 MG tablet  Take 1 tablet by mouth every 4 (four) hours as needed for severe pain. 04/15/19 04/14/20  En[place] DerryWagner, Cabrini Ruggieri, PA-C    Allergies Penicillins  No family history on file.  Social History Social History   Tobacco Use  . Smoking status: Never Smoker  . Smokeless tobacco: Never Used  Substance Use Topics  . Alcohol use: No  . Drug use: No     Review of Systems  Constitutional: No fever/chills. Gastrointestinal: No nausea, no vomiting.  Musculoskeletal: Positive for arm pain. Skin: Negative for rash, ecchymosis.  Positive for lacerations.   ____________________________________________   PHYSICAL EXAM:  VITAL SIGNS: ED Triage Vitals  Enc Vitals Group     BP 04/15/19 1015 (!) 170/108     Pulse Rate 04/15/19 1015 (!) 130     Resp 04/15/19 1015 (!) 26     Temp no 04/15/19 1015 99.7 F (37.6 C)     Temp Source 04/15/19 1015 Oral     SpO2 04/15/19 1015 96 %     Weight 04/15/19 1016 275 lb 9.2 oz (125 kg)     Height 04/15/19 1016 5\' 9"  (1.753 m)     Head Circumference --      Peak Flow --      Pain Score 04/15/19 1016 10     Pain Loc --      Pain Edu? --      Excl. in GC? --  Constitutional: Alert and oriented. Well appearing and in no acute distress. Eyes: Conjunctivae are normal. PERRL. EOMI. Head: Atraumatic. ENT:      Ears:      Nose: No congestion/rhinnorhea.      Mouth/Throat: Mucous membranes are moist.  Neck: No stridor.  Cardiovascular: Normal rate, regular rhythm.  Good peripheral circulation. Respiratory: Normal respiratory effort without tachypnea or retractions. Lungs CTAB. Good air entry to the bases with no decreased or absent breath sounds. Musculoskeletal: Full range of motion to all extremities. No gross deformities appreciated. Neurologic:  Normal speech and language. No gross focal neurologic deficits are appreciated.  Skin:  Skin is warm, dry. 2 cm laceration to right elbow. 4 cm, 1 cm, 1/2 cm, 1/2 cm laceration to left forearm with surrounding  ecchymosis. Psychiatric: Mood and affect are normal. Speech and behavior are normal. Patient exhibits appropriate insight and judgement.   ____________________________________________   LABS (all labs ordered are listed, but only abnormal results are displayed)  Labs Reviewed - No data to display ____________________________________________  EKG   ____________________________________________  RADIOLOGY  Dg Foot Complete Left  Result Date: 04/15/2019 CLINICAL DATA:  States she went out to feed the dogs. Pt's dogs were fighting and she tried to break them up, then hit her left 2nd and 3rd toe. Pt recently fx 3rd toe. EXAM: LEFT FOOT - COMPLETE 3+ VIEW COMPARISON:  None. FINDINGS: Transverse minimally displaced comminuted fracture of the tuft of the third distal phalanx. No other acute fracture or dislocation. No aggressive osseous lesion. Small plantar calcaneal spur. IMPRESSION: 1. Transverse minimally displaced comminuted fracture of the tuft of the third distal phalanx. Electronically Signed   By: Kathreen Devoid   On: 04/15/2019 12:42    ____________________________________________    PROCEDURES  Procedure(s) performed:    Procedures  LACERATION REPAIR Performed by: Laban Emperor  Consent: Verbal consent obtained.  Consent given by: patient  Prepped and Draped in normal sterile fashion  Wound explored: No foreign bodies   Laceration Location: right elbow  Laceration Length: 2 cm  Anesthesia: None  Local anesthetic: lidocaine 1% without epinephrine  Anesthetic total: 5 ml  Irrigation method: syringe  Amount of cleaning: 58ml normal saline  Skin closure: 4-0 nylon  Number of sutures: 4  Technique: Simple interrupted  Patient tolerance: Patient tolerated the procedure well with no immediate complications.  LACERATION REPAIR Performed by: Laban Emperor  Consent: Verbal consent obtained.  Consent given by: patient  Prepped and Draped in normal  sterile fashion  Wound explored: No foreign bodies   Laceration Location: left forearm  Laceration Length: 4 cm  Anesthesia: None  Local anesthetic: lidocaine 1% without epinephrine  Anesthetic total: 8 ml  Irrigation method: syringe  Amount of cleaning: 512ml normal saline  Skin closure: 4-0 nylon  Number of sutures: 9  Technique: Simple interrupted  Patient tolerance: Patient tolerated the procedure well with no immediate complications.  LACERATION REPAIR Performed by: Laban Emperor  Consent: Verbal consent obtained.  Consent given by: patient  Prepped and Draped in normal sterile fashion  Wound explored: No foreign bodies   Laceration Location: left forearm  Laceration Length: 1 cm  Anesthesia: None  Local anesthetic: lidocaine 1% without epinephrine  Anesthetic total: 4 ml  Irrigation method: syringe  Amount of cleaning: 537ml normal saline  Skin closure: 4-0 nylon  Number of sutures: 3  Technique: Simple interrupted  Patient tolerance: Patient tolerated the procedure well with no immediate complications.  LACERATION REPAIR Performed by: Laban Emperor  Consent: Verbal consent obtained.  Consent given by: patient  Prepped and Draped in normal sterile fashion  Wound explored: No foreign bodies   Laceration Location: left forearm  Laceration Length: 2, 1/2 cm  Anesthesia: None  Local anesthetic: lidocaine 1% without epinephrine  Anesthetic total: 3 ml  Irrigation method: syringe  Amount of cleaning: 500ml normal saline  Skin closure: 4-0 nylon  Number of sutures: 2  Technique: Simple interrupted  Patient tolerance: Patient tolerated the procedure well with no immediate complications.   Medications  oxyCODONE-acetaminophen (PERCOCET/ROXICET) 5-325 MG per tablet 1 tablet (1 tablet Oral Given 04/15/19 1150)  lidocaine (PF) (XYLOCAINE) 1 % injection 15 mL (20 mLs Intradermal Given 04/15/19 1414)  doxycycline (VIBRA-TABS) tablet  100 mg (100 mg Oral Given 04/15/19 1415)     ____________________________________________   INITIAL IMPRESSION / ASSESSMENT AND PLAN / ED COURSE  Pertinent labs & imaging results that were available during my care of the patient were reviewed by me and considered in my medical decision making (see chart for details).  Review of the [place] CSRS was performed in accordance of the [facility] prior to dispensing any controlled drugs.   Patient presented to the emergency department for evaluation of dog bite.  Vital signs and exam are reassuring.  Lacerations were sutured in the emergency department.  Patient's tetanus is up-to-date.  Patient will be started on doxycycline, as she is allergic to Augmentin.  Patient also requested that her foot be re-x-rayed.  She had an injury about 1 month ago that resulted in a fracture.  She has not been able to follow-up with podiatry because she has not had the finances to follow-up with them.  She was encouraged to follow-up with podiatry or primary care if she is unable to get into podiatry.  Patient will be discharged home with prescriptions for doxycycline and a short course of Percocet.. Patient is to follow up with primary care as directed. Patient is given ED precautions to return to the ED for any worsening or new symptoms.  Shelby Dawesngela D Linse was evaluated in Emergency Department on 04/15/2019 for the symptoms described in the history of present illness. She was evaluated in the context of the global COVID-19 pandemic, which necessitated consideration that the patient might be at risk for infection with the SARS-CoV-2 virus that causes COVID-19. Institutional protocols and algorithms that pertain to the evaluation of patients at risk for COVID-19 are in a state of rapid change based on information released by regulatory bodies including the CDC and federal and state organizations. These policies and algorithms were followed during the patient's care in the  ED.   ____________________________________________  FINAL CLINICAL IMPRESSION(S) / ED DIAGNOSES  Final diagnoses:  Dog bite, initial encounter  Injury of left foot, subsequent encounter      NEW MEDICATIONS STARTED DURING THIS VISIT:  ED Discharge Orders         Ordered    oxyCODONE-acetaminophen (PERCOCET) 5-325 MG tablet  Every 4 hours PRN     04/15/19 1351    doxycycline (VIBRAMYCIN) 50 MG capsule  2 times daily     04/15/19 1351              This chart was dictated using voice recognition software/Dragon. Despite best efforts to proofread, errors can occur which can change the meaning. Any change was purely unintentional.    Enid DerryWagner, Vale Mousseau, PA-C 04/15/19 1447    Emily FilbertWilliams, Jonathan E, MD 04/15/19 1501

## 2019-04-15 NOTE — ED Triage Notes (Signed)
Dog bites right elbow and left arm.  Also may have reinjured the toe.  Says they are her dogs and vaccinations are up to date.  Pa tient is tearful.

## 2019-04-15 NOTE — ED Notes (Signed)
Dog bite reported to [employer] as requested by patient.

## 2019-04-15 NOTE — ED Notes (Signed)
See triage note  States she went out to feed the dogs   One of them jump over her and went after her other dog    Bites noted to right elbow and left arm    Also thinks she may have hit her left foot again  Having pain to toes

## 2019-04-24 ENCOUNTER — Other Ambulatory Visit: Payer: Self-pay

## 2019-04-24 ENCOUNTER — Emergency Department
Admission: EM | Admit: 2019-04-24 | Discharge: 2019-04-24 | Disposition: A | Discharge status: Home / Self Care | Payer: Self-pay | Attending: Emergency Medicine | Admitting: Emergency Medicine

## 2019-04-24 ENCOUNTER — Emergency Department: Payer: Self-pay

## 2019-04-24 ENCOUNTER — Encounter: Payer: Self-pay | Admitting: Emergency Medicine

## 2019-04-24 DIAGNOSIS — L089 Local infection of the skin and subcutaneous tissue, unspecified: Secondary | ICD-10-CM

## 2019-04-24 DIAGNOSIS — Y829 Unspecified medical devices associated with adverse incidents: Secondary | ICD-10-CM | POA: Insufficient documentation

## 2019-04-24 DIAGNOSIS — T8141XA Infection following a procedure, superficial incisional surgical site, initial encounter: Secondary | ICD-10-CM | POA: Insufficient documentation

## 2019-04-24 LAB — CBC WITH DIFFERENTIAL/PLATELET
Abs Immature Granulocytes: 0.03 10*3/uL (ref 0.00–0.07)
Basophils Absolute: 0 10*3/uL (ref 0.0–0.1)
Basophils Relative: 1 %
Eosinophils Absolute: 0.1 10*3/uL (ref 0.0–0.5)
Eosinophils Relative: 1 %
HCT: 42.8 % (ref 36.0–46.0)
Hemoglobin: 14.5 g/dL (ref 12.0–15.0)
Immature Granulocytes: 0 %
Lymphocytes Relative: 24 %
Lymphs Abs: 2.1 10*3/uL (ref 0.7–4.0)
MCH: 29.4 pg (ref 26.0–34.0)
MCHC: 33.9 g/dL (ref 30.0–36.0)
MCV: 86.8 fL (ref 80.0–100.0)
Monocytes Absolute: 0.6 10*3/uL (ref 0.1–1.0)
Monocytes Relative: 7 %
Neutro Abs: 5.8 10*3/uL (ref 1.7–7.7)
Neutrophils Relative %: 67 %
Platelets: 304 10*3/uL (ref 150–400)
RBC: 4.93 MIL/uL (ref 3.87–5.11)
RDW: 12.6 % (ref 11.5–15.5)
WBC: 8.7 10*3/uL (ref 4.0–10.5)
nRBC: 0 % (ref 0.0–0.2)

## 2019-04-24 LAB — COMPREHENSIVE METABOLIC PANEL
ALT: 38 U/L (ref 0–44)
AST: 28 U/L (ref 15–41)
Albumin: 4.1 g/dL (ref 3.5–5.0)
Alkaline Phosphatase: 147 U/L — ABNORMAL HIGH (ref 38–126)
Anion gap: 10 (ref 5–15)
BUN: 7 mg/dL (ref 6–20)
CO2: 26 mmol/L (ref 22–32)
Calcium: 9.4 mg/dL (ref 8.9–10.3)
Chloride: 104 mmol/L (ref 98–111)
Creatinine, Ser: 0.73 mg/dL (ref 0.44–1.00)
GFR calc Af Amer: >60 mL/min (ref >60)
GFR calc non Af Amer: >60 mL/min (ref >60)
Glucose, Bld: 103 mg/dL — ABNORMAL HIGH (ref 70–99)
Potassium: 3.6 mmol/L (ref 3.5–5.1)
Sodium: 140 mmol/L (ref 135–145)
Total Bilirubin: 0.7 mg/dL (ref 0.3–1.2)
Total Protein: 7.8 g/dL (ref 6.5–8.1)

## 2019-04-24 LAB — LACTIC ACID, PLASMA: Lactic Acid, Venous: 1 mmol/L (ref 0.5–1.9)

## 2019-04-24 MED ORDER — SODIUM CHLORIDE 0.9 % IV SOLN
1.0000 g | Freq: Once | INTRAVENOUS | Status: AC
  Administered 2019-04-24: 1 g via INTRAVENOUS
  Filled 2019-04-24: qty 10

## 2019-04-24 MED ORDER — SULFAMETHOXAZOLE-TRIMETHOPRIM 800-160 MG PO TABS: 1.0000 | ORAL_TABLET | Freq: Two times a day (BID) | ORAL | 0 refills | Status: AC

## 2019-04-24 MED ORDER — IOHEXOL 300 MG/ML  SOLN
125.0000 mL | Freq: Once | INTRAMUSCULAR | Status: AC | PRN
  Administered 2019-04-24: 125 mL via INTRAVENOUS
  Filled 2019-04-24: qty 125

## 2019-04-24 MED ORDER — CEPHALEXIN 500 MG PO CAPS: 500.0000 mg | ORAL_CAPSULE | Freq: Three times a day (TID) | ORAL | 0 refills | Status: AC

## 2019-04-24 NOTE — Discharge Instructions (Addendum)
Please follow-up with wound care as soon as possible. Take Bactrim twice daily for the next 7 days. Take Keflex 3 times daily for the next 7 days. Cleanse wound once daily with mild soap. Return to the emergency department if redness surrounding wound site worsens. Keep gauze and tape over wound.

## 2019-04-24 NOTE — ED Notes (Addendum)
Pt states she had a dog bite last Thursday, she came to [facility] ED and got sutures in her left forearm. On assessment, suture line is purulent and red. Edges not approximated. Denies pain and has full ROM in LUE. States she has been taking antibiotic as prescribed.

## 2019-04-24 NOTE — ED Provider Notes (Signed)
[facility] Emergency Department Provider Note  ____________________________________________  Time seen: Approximately 4:01 PM  I have reviewed the triage vital signs and the nursing notes.   HISTORY  Chief Complaint Wound Check    HPI Shelby Ward is a 38 y.o. female presents to the emergency department with concern for an infected dog bite wound along the left forearm.  Patient has had sutures in for 9 days.  Patient states that she was initially taking doxycycline but had a period of time where she stopped.  Patient states that she had some streaking up her left forearm which improved and patient restarted her doxycycline.  She has had low-grade fever and chills at home.  She has noticed wound dehiscence.  No foul odor from wound site.  She states that she has been cleansing wound multiple times a day.  No other alleviating measures have been attempted.        Past Medical History:  Diagnosis Date  . Migraine     There are no active problems to display for this patient.   Past Surgical History:  Procedure Laterality Date  . TUBAL LIGATION      Prior to Admission medications   Medication Sig Start Date End Date Taking? Authorizing Provider  cephALEXin (KEFLEX) 500 MG capsule Take 1 capsule (500 mg total) by mouth 3 (three) times daily for 7 days. 04/24/19 05/01/19  Orvil FeilWoods, Issak Goley M, PA-C  diphenhydrAMINE (BENADRYL) 25 MG tablet Take 1 tablet (25 mg total) by mouth every 6 (six) hours as needed (nausea/headaches with Reglan). 12/06/14   Street, Mund[place] CornerMercedes, PA-C  doxycycline (VIBRAMYCIN) 50 MG capsule Take 2 capsules (100 mg total) by mouth 2 (two) times daily for 10 days. 04/15/19 04/25/19  Enid DerryWagner, Ashley, PA-C  metoCLOPramide (REGLAN) 10 MG tablet Take 1 tablet (10 mg total) by mouth every 6 (six) hours as needed for nausea (nausea/headache). 12/06/14   Street, [place]cedes, PA-C  naproxen (NAPROSYN) 500 MG tablet Take 1 tablet (500 mg total) by mouth 2 (two)  times daily as needed for mild pain, moderate pain or headache (TAKE WITH MEALS.). 12/06/14   Street, [place] Ac[place]cedes, PA-C  oxyCODONE-acetaminophen (PERCOCET) 5-325 MG tablet Take 1 tablet by mouth every 4 (four) hours as needed for severe pain. 04/15/19 04/14/20  Enid DerryWagner, Ashley, PA-C  sulfamethoxazole-trimethoprim (BACTRIM DS) 800-160 MG tablet Take 1 tablet by mouth 2 (two) times daily for 7 days. 04/24/19 05/01/19  Orvil FeilWoods, Jermanie Minshall M, PA-C    Allergies Penicillins  No family history on file.  Social History Social History   Tobacco Use  . Smoking status: Never Smoker  . Smokeless tobacco: Never Used  Substance Use Topics  . Alcohol use: No  . Drug use: No     Review of Systems  Constitutional: No fever/chills Eyes: No visual changes. No discharge ENT: No upper respiratory complaints. Cardiovascular: no chest pain. Respiratory: no cough. No SOB. Gastrointestinal: No abdominal pain.  No nausea, no vomiting.  No diarrhea.  No constipation. Genitourinary: Negative for dysuria. No hematuria Musculoskeletal: Negative for musculoskeletal pain. Skin: Patient has dog bite wound of left forearm.  Neurological: Negative for headaches, focal weakness or numbness.   ____________________________________________   PHYSICAL EXAM:  VITAL SIGNS: ED Triage Vitals [04/24/19 1207]  Enc Vitals Group     BP (!) 167/98     Pulse Rate 90     Resp 20     Temp 99.4 F (37.4 C)     Temp Source Oral  SpO2 99 %     Weight 280 lb (127 kg)     Height 5\' 9"  (1.753 m)     Head Circumference      Peak Flow      Pain Score 0     Pain Loc      Pain Edu?      Excl. in [place]?      Constitutional: Alert and oriented. Well appearing and in no acute distress. Eyes: Conjunctivae are normal. PERRL. EOMI. Head: Atraumatic. Cardiovascular: Normal rate, regular rhythm. Normal S1 and S2.  Good peripheral circulation. Respiratory: Normal respiratory effort without tachypnea or retractions. Lungs CTAB. Good air  entry to the bases with no decreased or absent breath sounds. Musculoskeletal: Full range of motion to all extremities. No gross deformities appreciated. Neurologic:  Normal speech and language. No gross focal neurologic deficits are appreciated.  Skin: Patient has a 4 cm laceration along left forearm with approximately 2 cm of surrounding cellulitis and approximately 6 cm of improving regressing cellulitis along the superior aspect of the wound.  Patient has overlying slough but no frank purulent exudate being expressed from wound.  There is yellow/clear serosanguineous exudate being expressed from wound site.  Wound is dehisced with approximately 1 cm of wound depth. Psychiatric: Mood and affect are normal. Speech and behavior are normal. Patient exhibits appropriate insight and judgement.   ____________________________________________   LABS (all labs ordered are listed, but only abnormal results are displayed)  Labs Reviewed  COMPREHENSIVE METABOLIC PANEL - Abnormal; Notable for the following components:      Result Value   Glucose, Bld 103 (*)    Alkaline Phosphatase 147 (*)    All other components within normal limits  AEROBIC CULTURE (SUPERFICIAL SPECIMEN)  CULTURE, BLOOD (ROUTINE X 2)  CULTURE, BLOOD (ROUTINE X 2)  CBC WITH DIFFERENTIAL/PLATELET  LACTIC ACID, PLASMA   ____________________________________________  EKG   ____________________________________________  RADIOLOGY I personally viewed and evaluated these images as part of my medical decision making, as well as reviewing the written report by the radiologist.    Ct Forearm Left W Contrast  Result Date: 04/24/2019 CLINICAL DATA:  Dog bite to the forearm last Thursday. Purulent suture line. EXAM: CT OF THE UPPER LEFT EXTREMITY WITH CONTRAST TECHNIQUE: Multidetector CT imaging of the upper left extremity was performed according to the standard protocol following intravenous contrast administration. COMPARISON:   None. CONTRAST:  174mL OMNIPAQUE IOHEXOL 300 MG/ML  SOLN FINDINGS: Bones/Joint/Cartilage No fracture or osteomyelitis identified. Ligaments Suboptimally assessed by CT. Muscles and Tendons Unremarkable Soft tissues Laceration along the anterior and radial side of the mid to distal forearm associated with a 6.0 by 1.3 by 7.8 cm region of cutaneous thickening and subcutaneous stranding/edema compatible with inflammatory phlegmon. No drainable abscess. A small portion of this extends adjacent to the flexor carpi radialis to tendon. The radial artery appears patent. IMPRESSION: 1. Inflammatory phlegmon along the vicinity of the laceration in the mid to distal forearm along the anterior/radial side. No drainable abscess or underlying osteomyelitis. Electronically Signed   By: Van Clines M.D.   On: 04/24/2019 18:09    ____________________________________________    PROCEDURES  Procedure(s) performed:    Procedures    Medications  cefTRIAXone (ROCEPHIN) 1 g in sodium chloride 0.9 % 100 mL IVPB (0 g Intravenous Stopped 04/24/19 1728)  iohexol (OMNIPAQUE) 300 MG/ML solution 125 mL (125 mLs Intravenous Contrast Given 04/24/19 1727)     ____________________________________________   INITIAL IMPRESSION / ASSESSMENT AND PLAN /  ED COURSE  Pertinent labs & imaging results that were available during my care of the patient were reviewed by me and considered in my medical decision making (see chart for details).  Review of the [place] CSRS was performed in accordance of the [facility] prior to dispensing any controlled drugs.           Assessment and Plan:  Dog Bite Wound:  38 year old female presents to the emergency department with concern for an infected dog bite wound along the left forearm.  She had dog bite wound repaired with sutures 9 days ago and has been noncompliant with doxycycline.  Patient had low-grade fever at triage and was mildly tachycardic and tachypneic.  Physical exam was  concerning for wound dehiscence with surrounding cellulitis at wound site.  Sutures were removed and dog bite wound was copiously irrigated with normal saline and Betadine.  Differential diagnosis included cellulitis, infected dog bite wound and osteomyelitis...  Patient had basic labs obtained in the emergency department and received IV Rocephin  Basic labs were reassuring in the emergency department.  No leukocytosis.  Lactate was within reference range.  CT of the left forearm did not reveal loculated fluid collections that would require drainage or findings that were consistent with osteomyelitis.  Blood cultures and wound cultures are pending.  I added Bactrim and Keflex to patient's doxycycline.  Advised her to continue taking doxycycline until completion.  Patient was given a referral to wound care.  All patient questions were answered.    ____________________________________________  FINAL CLINICAL IMPRESSION(S) / ED DIAGNOSES  Final diagnoses:  Infected dog bite      NEW MEDICATIONS STARTED DURING THIS VISIT:  ED Discharge Orders         Ordered    sulfamethoxazole-trimethoprim (BACTRIM DS) 800-160 MG tablet  2 times daily     04/24/19 1924    cephALEXin (KEFLEX) 500 MG capsule  3 times daily     04/24/19 1924              This chart was dictated using voice recognition software/Dragon. Despite best efforts to proofread, errors can occur which can change the meaning. Any change was purely unintentional.    Orvil FeilWoods, Londin Antone M, PA-C 04/25/19 0105    Arnaldo NatalMalinda, Paul F, MD 04/25/19 [phone]

## 2019-04-24 NOTE — ED Triage Notes (Signed)
States looks infected around suture line L forearm from dog bite one week ago.

## 2019-04-28 LAB — AEROBIC CULTURE W GRAM STAIN (SUPERFICIAL SPECIMEN)
Culture: NORMAL
Special Requests: NORMAL

## 2019-04-29 ENCOUNTER — Encounter: Payer: Self-pay | Attending: Physician Assistant | Admitting: Physician Assistant

## 2019-04-29 ENCOUNTER — Other Ambulatory Visit: Payer: Self-pay

## 2019-04-29 DIAGNOSIS — W540XXA Bitten by dog, initial encounter: Secondary | ICD-10-CM | POA: Insufficient documentation

## 2019-04-29 DIAGNOSIS — S51802A Unspecified open wound of left forearm, initial encounter: Secondary | ICD-10-CM | POA: Insufficient documentation

## 2019-04-29 DIAGNOSIS — Z87891 Personal history of nicotine dependence: Secondary | ICD-10-CM | POA: Insufficient documentation

## 2019-04-29 NOTE — Progress Notes (Signed)
Called and spoke with patient about wound. She reports that she saw the wound-care specialist today and had the wound debrided and packed with collagen. Overall, she is feeling better and believes her wound is healing well. She denies any fevers/chillds.   Kristeen Miss, PharmD Clinical Pharmacist

## 2019-04-30 NOTE — Progress Notes (Signed)
[name], [name] ([id number]) Visit Report for 04/29/2019 Abuse/Suicide Risk Screen Details Patient Name: Shelby Ward, Shelby Ward. Date of Service: 04/29/2019 2:15 PM Medical Record Number: [id number] Patient Account Number: [id number] Date of Birth/Sex: 1981/06/04 (38 y.o. F) Treating RN: Montey Hora Primary Care Verda Mehta: PATIENT, NO Other Clinician: Referring Alba Perillo: Conni Slipper Treating Ermon Sagan/Extender: STONE III, HOYT Weeks in Treatment: 0 Abuse/Suicide Risk Screen Items Answer ABUSE RISK SCREEN: Has anyone close to you tried to hurt or harm you recentlyo No Do you feel uncomfortable with anyone in your familyo No Has anyone forced you do things that you didnot want to doo No Electronic Signature(s) Signed: 04/29/2019 4:10:49 PM By: Montey Hora Entered By: Montey Hora on 04/29/2019 14:25:07 Shelby Ward ([id number]) -------------------------------------------------------------------------------- Activities of Daily Living Details Patient Name: Shelby Coe D. Date of Service: 04/29/2019 2:15 PM Medical Record Number: [id number] Patient Account Number: [id number] Date of Birth/Sex: 1981/06/06 (38 y.o. F) Treating RN: Montey Hora Primary Care Tremeka Helbling: PATIENT, NO Other Clinician: Referring Jamerion Cabello: Conni Slipper Treating Aimar Shrewsbury/Extender: Melburn Hake, HOYT Weeks in Treatment: 0 Activities of Daily Living Items Answer Activities of Daily Living (Please select one for each item) Drive Automobile Completely Able Take Medications Completely Able Use Telephone Completely Able Care for Appearance Completely Able Use Toilet Completely Able Bath / Shower Completely Able Dress Self Completely Able Feed Self Completely Able Walk Completely Able Get In / Out Bed Completely Able Housework Completely Able Prepare Meals Completely Able Handle Money Completely Able Shop for Self Completely Able Electronic Signature(s) Signed: 04/29/2019 4:10:49 PM By: Montey Hora Entered By: Montey Hora on 04/29/2019 14:25:26 Shelby Ward ([id number]) -------------------------------------------------------------------------------- Education Screening Details Patient Name: Shelby Coe D. Date of Service: 04/29/2019 2:15 PM Medical Record Number: [id number] Patient Account Number: [id number] Date of Birth/Sex: Mar 02, 1981 (38 y.o. F) Treating RN: Montey Hora Primary Care Yameli Delamater: PATIENT, NO Other Clinician: Referring Furkan Keenum: Conni Slipper Treating Kanetra Ho/Extender: Melburn Hake, HOYT Weeks in Treatment: 0 Primary Learner Assessed: Patient Learning Preferences/Education Level/Primary Language Learning Preference: Explanation, Demonstration Highest Education Level: High School Preferred Language: English Cognitive Barrier Language Barrier: No Translator Needed: No Memory Deficit: No Emotional Barrier: No Cultural/Religious Beliefs Affecting Medical Care: No Physical Barrier Impaired Vision: No Impaired Hearing: No Decreased Hand dexterity: No Knowledge/Comprehension Knowledge Level: Medium Comprehension Level: Medium Ability to understand written Medium instructions: Ability to understand verbal Medium instructions: Motivation Anxiety Level: Calm Cooperation: Cooperative Education Importance: Acknowledges Need Interest in Health Problems: Asks Questions Perception: Coherent Willingness to Engage in Self- Medium Management Activities: Readiness to Engage in Self- Medium Management Activities: Electronic Signature(s) Signed: 04/29/2019 4:10:49 PM By: Montey Hora Entered By: Montey Hora on 04/29/2019 14:25:46 Shelby Ward ([id number]) -------------------------------------------------------------------------------- Fall Risk Assessment Details Patient Name: Shelby Ward. Date of Service: 04/29/2019 2:15 PM Medical Record Number: [id number] Patient Account Number: [id number] Date of Birth/Sex: 1981/04/08 (38 y.o.  F) Treating RN: Montey Hora Primary Care Sakura Denis: PATIENT, NO Other Clinician: Referring Jeiden Daughtridge: Conni Slipper Treating Seerat Peaden/Extender: Melburn Hake, HOYT Weeks in Treatment: 0 Fall Risk Assessment Items Have you had 2 or more falls in the last 12 monthso 0 No Have you had any fall that resulted in injury in the last 12 monthso 0 No FALLS RISK SCREEN History of falling - immediate or within 3 months 0 No Secondary diagnosis (Do you have 2 or more medical diagnoseso) 0 No Ambulatory aid None/bed rest/wheelchair/nurse 0 Yes Crutches/cane/walker 0 No Furniture 0 No Intravenous therapy Access/Saline/Heparin Lock 0 No Gait/Transferring Normal/ bed rest/ wheelchair 0 Yes Weak (  short steps with or without shuffle, stooped but able to lift head while 0 No walking, may seek support from furniture) Impaired (short steps with shuffle, may have difficulty arising from chair, head 0 No down, impaired balance) Mental Status Oriented to own ability 0 Yes Electronic Signature(s) Signed: 04/29/2019 4:10:49 PM By: Curtis Sitesorthy, Joanna Entered By: Curtis Sitesorthy, Joanna on 04/29/2019 14:25:55 Shelby Ward, Shelby D. ([id number]) -------------------------------------------------------------------------------- Foot Assessment Details Patient Name: Shelby Ward, Shelby D. Date of Service: 04/29/2019 2:15 PM Medical Record Number: [id number] Patient Account Number: [id number] Date of Birth/Sex: 06/29/1981 (38 y.o. F) Treating RN: Curtis Sitesorthy, Joanna Primary Care Anneta Rounds: PATIENT, NO Other Clinician: Referring Kazim Corrales: Dorothea GlassmanMALINDA, PAUL Treating Lakendria Nicastro/Extender: STONE III, HOYT Weeks in Treatment: 0 Foot Assessment Items Site Locations + = Sensation present, - = Sensation absent, C = Callus, U = Ulcer R = Redness, W = Warmth, M = Maceration, PU = Pre-ulcerative lesion F = Fissure, S = Swelling, D = Dryness Assessment Right: Left: Other Deformity: No No Prior Foot Ulcer: No No Prior Amputation: No No Charcot Joint: No  No Ambulatory Status: Ambulatory Without Help Gait: Steady Electronic Signature(s) Signed: 04/29/2019 4:10:49 PM By: Curtis Sitesorthy, Joanna Entered By: Curtis Sitesorthy, Joanna on 04/29/2019 14:26:09 Shelby Ward, Shelby D. ([id number]) -------------------------------------------------------------------------------- Nutrition Risk Screening Details Patient Name: Shelby Ward, Shelby D. Date of Service: 04/29/2019 2:15 PM Medical Record Number: [id number] Patient Account Number: [id number] Date of Birth/Sex: 01/01/1981 (38 y.o. F) Treating RN: Curtis Sitesorthy, Joanna Primary Care Evann Koelzer: PATIENT, NO Other Clinician: Referring Joeangel Jeanpaul: Dorothea GlassmanMALINDA, PAUL Treating Jailani Hogans/Extender: STONE III, HOYT Weeks in Treatment: 0 Height (in): 69 Weight (lbs): 280 Body Mass Index (BMI): 41.3 Nutrition Risk Screening Items Score Screening NUTRITION RISK SCREEN: I have an illness or condition that made me change the kind and/or amount of 0 No food I eat I eat fewer than two meals per day 0 No I eat few fruits and vegetables, or milk products 0 No I have three or more drinks of beer, liquor or wine almost every day 0 No I have tooth or mouth problems that make it hard for me to eat 0 No I don't always have enough money to buy the food I need 0 No I eat alone most of the time 0 No I take three or more different prescribed or over-the-counter drugs a day 0 No Without wanting to, I have lost or gained 10 pounds in the last six months 0 No I am not always physically able to shop, cook and/or feed myself 0 No Nutrition Protocols Good Risk Protocol 0 No interventions needed Moderate Risk Protocol High Risk Proctocol Risk Level: Good Risk Score: 0 Electronic Signature(s) Signed: 04/29/2019 4:10:49 PM By: Curtis Sitesorthy, Joanna Entered By: Curtis Sitesorthy, Joanna on 04/29/2019 14:26:02

## 2019-05-01 NOTE — Progress Notes (Signed)
Shelby Ward ([id number]) Visit Report for 04/29/2019 Chief Complaint Document Details Patient Name: Shelby, Ward. Date of Service: 04/29/2019 2:15 PM Medical Record Number: [id number] Patient Account Number: [id number] Date of Birth/Sex: Nov 12, 1980 (38 y.o. F) Treating RN: Army Melia Primary Care Provider: PATIENT, NO Other Clinician: Referring Provider: Conni Slipper Treating Provider/Extender: Shelby Ward Weeks in Treatment: 0 Information Obtained from: Patient Chief Complaint Left forearm dog bite Electronic Signature(s) Signed: 05/01/2019 12:16:19 AM By: Worthy Keeler Ward Entered By: Worthy Keeler on 04/29/2019 14:40:09 Eda Paschal ([id number]) -------------------------------------------------------------------------------- Debridement Details Patient Name: Shelby Coe D. Date of Service: 04/29/2019 2:15 PM Medical Record Number: [id number] Patient Account Number: [id number] Date of Birth/Sex: 1981-08-07 (38 y.o. F) Treating RN: Army Melia Primary Care Provider: PATIENT, NO Other Clinician: Referring Provider: Conni Slipper Treating Provider/Extender: Shelby Ward Weeks in Treatment: 0 Debridement Performed for Wound #1 Left Forearm Assessment: Performed By: Physician Shelby Ward Debridement Type: Debridement Level of Consciousness (Pre- Awake and Alert procedure): Pre-procedure Verification/Time Yes - 14:22 Out Taken: Start Time: 14:23 Total Area Debrided (L x W): 0.8 (cm) x 3.2 (cm) = 2.56 (cm) Tissue and other material Viable, Non-Viable, Slough, Subcutaneous, Slough debrided: Level: Skin/Subcutaneous Tissue Debridement Description: Excisional Instrument: Curette Bleeding: Minimum Hemostasis Achieved: Pressure End Time: 14:45 Response to Treatment: Procedure was tolerated well Level of Consciousness Awake and Alert (Post-procedure): Post Debridement Measurements of Total Wound Length: (cm) 0.8 Width: (cm)  3.2 Depth: (cm) 0.6 Volume: (cm) 1.206 Character of Wound/Ulcer Post Debridement: Stable Post Procedure Diagnosis Same as Pre-procedure Electronic Signature(s) Signed: 04/29/2019 3:30:04 PM By: Army Melia Signed: 05/01/2019 12:16:19 AM By: Worthy Keeler Ward Entered By: Army Melia on 04/29/2019 14:45:46 Eda Paschal ([id number]) -------------------------------------------------------------------------------- HPI Details Patient Name: Shelby Coe D. Date of Service: 04/29/2019 2:15 PM Medical Record Number: [id number] Patient Account Number: [id number] Date of Birth/Sex: 1981-03-19 (38 y.o. F) Treating RN: Army Melia Primary Care Provider: PATIENT, NO Other Clinician: Referring Provider: Conni Slipper Treating Provider/Extender: Shelby Ward Weeks in Treatment: 0 History of Present Illness HPI Description: 04/29/2019 on evaluation today patient actually presents for initial evaluation in our clinic concerning issues that she is having with a wound which is a trauma on the left forearm. This is actually an injury that occurred as a result of being bitten by her dog unfortunately. She was seen in the emergency department where sutures were placed unfortunately not all the sutures took I think there was much more damage internally than was able to be repaired just on a simple suture repair. Nonetheless she does have some slough covering the surface of the wound and she has 2 marks on her arm both of which seem to have some depth to them I believe they likely do connect. Other than this she tells me that she really has been fairly healthy until just in the past 1-2 months she also dropped a can on her foot and broke 1 of the toes on the left foot that is actually healing she is in a postop shoe for that. She is obviously somewhat frustrated with the situation. Electronic Signature(s) Signed: 04/30/2019 5:50:12 PM By: Worthy Keeler Ward Entered By: Worthy Keeler on  04/30/2019 17:50:12 Eda Paschal ([id number]) -------------------------------------------------------------------------------- Physical Exam Details Patient Name: Shelby, Ward. Date of Service: 04/29/2019 2:15 PM Medical Record Number: [id number] Patient Account Number: [id number] Date of Birth/Sex: March 09, 1981 (38 y.o. F) Treating RN: Army Melia Primary Care Provider: PATIENT, NO Other  Clinician: Referring Provider: Dorothea GlassmanMALINDA, Ward Treating Provider/Extender: Shelby III, Ward Weeks in Treatment: 0 Constitutional sitting or standing blood pressure is within target range for patient.. pulse regular and within target range for patient.Marland Kitchen. respirations regular, non-labored and within target range for patient.Marland Kitchen. temperature within target range for patient.. Well- nourished and well-hydrated in no acute distress. Eyes conjunctiva clear no eyelid edema noted. pupils equal round and reactive to light and accommodation. Ears, Nose, Mouth, and Throat no gross abnormality of ear auricles or external auditory canals. normal hearing noted during conversation. mucus membranes moist. Respiratory normal breathing without difficulty. clear to auscultation bilaterally. Cardiovascular regular rate and rhythm with normal S1, S2. no clubbing, cyanosis, significant edema, <3 sec cap refill. Gastrointestinal (GI) soft, non-tender, non-distended, +BS. no ventral hernia noted. Musculoskeletal normal gait and posture. no significant deformity or arthritic changes, no loss or range of motion, no clubbing. Psychiatric this patient is able to make decisions and demonstrates good insight into disease process. Alert and Oriented x 3. pleasant and cooperative. Notes Upon inspection today patient's wound bed did require a fairly significant sharp debridement in order to clear away slough and necrotic material from the surface of the wound. And ended up the wound did actually go deeper it was not until I got into  the deeper region that she actually began to experience some difficulty with pain. With that being said I was able to clear away the necrotic tissue as well as a hematoma which had developed and was present in the base of the wound. The patient tolerated all of this today without complication she did have some pain with the debridement but again post debridement it was obvious that the 2 wound openings actually connect 1 to the other. Electronic Signature(s) Signed: 04/30/2019 5:51:09 PM By: Lenda KelpStone III, Hoyt Ward Entered By: Lenda KelpStone III, Ward on 04/30/2019 17:51:09 Shelby Ward, Lamiracle D. ([id number]) -------------------------------------------------------------------------------- Physician Orders Details Patient Name: Shelby Ward, Amor D. Date of Service: 04/29/2019 2:15 PM Medical Record Number: [id number] Patient Account Number: [id number] Date of Birth/Sex: 07/15/1981 (38 y.o. F) Treating RN: Rodell PernaScott, Dajea Primary Care Provider: PATIENT, NO Other Clinician: Referring Provider: Dorothea GlassmanMALINDA, Ward Treating Provider/Extender: Linwood DibblesSTONE III, Ward Weeks in Treatment: 0 Verbal / Phone Orders: No Diagnosis Coding ICD-10 Coding Code Description S51.802A Unspecified open wound of left forearm, initial encounter W54.0XXA Bitten by dog, initial encounter [phone] Non-pressure chronic ulcer of skin of other sites with fat layer exposed Wound Cleansing Wound #1 Left Forearm o Clean wound with Normal Saline. Primary Wound Dressing Wound #1 Left Forearm o Silver Collagen - packed into wound with 1/4 packing strip behind to hold in place Secondary Dressing Wound #1 Left Forearm o Boardered Foam Dressing Dressing Change Frequency Wound #1 Left Forearm o Change dressing every other day. Follow-up Appointments Wound #1 Left Forearm o Return Appointment in 1 week. Electronic Signature(s) Signed: 04/29/2019 3:30:04 PM By: Rodell PernaScott, Dajea Signed: 05/01/2019 12:16:19 AM By: Lenda KelpStone III, Hoyt Ward Entered By:  Rodell PernaScott, Dajea on 04/29/2019 14:54:16 Shelby Ward, Shelby D. ([id number]) -------------------------------------------------------------------------------- Problem List Details Patient Name: Shelby Ward, Shelby D. Date of Service: 04/29/2019 2:15 PM Medical Record Number: [id number] Patient Account Number: [id number] Date of Birth/Sex: 06/29/1981 (38 y.o. F) Treating RN: Rodell PernaScott, Dajea Primary Care Provider: PATIENT, NO Other Clinician: Referring Provider: Dorothea GlassmanMALINDA, Ward Treating Provider/Extender: Linwood DibblesSTONE III, Ward Weeks in Treatment: 0 Active Problems ICD-10 Evaluated Encounter Code Description Active Date Today Diagnosis S51.802A Unspecified open wound of left forearm, initial encounter 04/29/2019 No Yes W54.0XXA Bitten by dog, initial encounter 04/29/2019 No  Yes L98.492 Non-pressure chronic ulcer of skin of other sites with fat layer 04/29/2019 No Yes exposed Inactive Problems Resolved Problems Electronic Signature(s) Signed: 05/01/2019 12:16:19 AM By: Lenda KelpStone III, Hoyt Ward Entered By: Lenda KelpStone III, Ward on 04/29/2019 14:39:16 Shelby Ward, Shelby D. ([id number]) -------------------------------------------------------------------------------- Progress Note Details Patient Name: Shelby Ward, Shelby D. Date of Service: 04/29/2019 2:15 PM Medical Record Number: [id number] Patient Account Number: [id number] Date of Birth/Sex: 12/11/1980 (38 y.o. F) Treating RN: Rodell PernaScott, Dajea Primary Care Provider: PATIENT, NO Other Clinician: Referring Provider: Dorothea GlassmanMALINDA, Ward Treating Provider/Extender: Linwood DibblesSTONE III, Ward Weeks in Treatment: 0 Subjective Chief Complaint Information obtained from Patient Left forearm dog bite History of Present Illness (HPI) 04/29/2019 on evaluation today patient actually presents for initial evaluation in our clinic concerning issues that she is having with a wound which is a trauma on the left forearm. This is actually an injury that occurred as a result of being bitten by her dog unfortunately. She  was seen in the emergency department where sutures were placed unfortunately not all the sutures took I think there was much more damage internally than was able to be repaired just on a simple suture repair. Nonetheless she does have some slough covering the surface of the wound and she has 2 marks on her arm both of which seem to have some depth to them I believe they likely do connect. Other than this she tells me that she really has been fairly healthy until just in the past 1-2 months she also dropped a can on her foot and broke 1 of the toes on the left foot that is actually healing she is in a postop shoe for that. She is obviously somewhat frustrated with the situation. Patient History Information obtained from Patient. Allergies penicillin (Reaction: itching) Family History Cancer - Mother,Maternal Grandparents, Diabetes - Maternal Grandparents, Lung Disease - Maternal Grandparents,Siblings, No family history of Heart Disease, Hereditary Spherocytosis, Hypertension, Kidney Disease, Seizures, Stroke, Thyroid Problems, Tuberculosis. Social History Former smoker - 8 years, Marital Status - Married, Alcohol Use - Rarely, Drug Use - No History, Caffeine Use - Daily. Medical History Eyes Patient has history of Cataracts - left eye Denies history of Glaucoma, Optic Neuritis Musculoskeletal Denies history of Gout, Rheumatoid Arthritis, Osteoarthritis Medical And Surgical History Notes Eyes retinoschesis Review of Systems (ROS) Constitutional Symptoms (General Health) Denies complaints or symptoms of Fatigue, Fever, Chills, Marked Weight Change. Eyes Denies complaints or symptoms of Dry Eyes, Vision Changes, Glasses / Contacts. Shelby Ward, Shelby D. ([id number]) Ear/Nose/Mouth/Throat Denies complaints or symptoms of Difficult clearing ears, Sinusitis. Hematologic/Lymphatic Denies complaints or symptoms of Bleeding / Clotting Disorders, Human Immunodeficiency Virus. Respiratory Denies  complaints or symptoms of Chronic or frequent coughs, Shortness of Breath. Cardiovascular Denies complaints or symptoms of Chest pain, LE edema. Gastrointestinal Denies complaints or symptoms of Frequent diarrhea, Nausea, Vomiting. Endocrine Denies complaints or symptoms of Hepatitis, Thyroid disease, Polydypsia (Excessive Thirst). Genitourinary Denies complaints or symptoms of Kidney failure/ Dialysis, Incontinence/dribbling. Immunological Denies complaints or symptoms of Hives, Itching. Integumentary (Skin) Complains or has symptoms of Wounds - dog bite. Denies complaints or symptoms of Bleeding or bruising tendency, Breakdown, Swelling. Musculoskeletal Denies complaints or symptoms of Muscle Pain, Muscle Weakness. Neurologic Denies complaints or symptoms of Numbness/parasthesias, Focal/Weakness. Psychiatric Denies complaints or symptoms of Anxiety, Claustrophobia. Objective Constitutional sitting or standing blood pressure is within target range for patient.. pulse regular and within target range for patient.Marland Kitchen. respirations regular, non-labored and within target range for patient.Marland Kitchen. temperature within target range for patient.. Well- nourished and  well-hydrated in no acute distress. Vitals Time Taken: 2:16 PM, Height: 69 in, Source: Measured, Weight: 280 lbs, Source: Measured, BMI: 41.3, Temperature: 98.7 F, Pulse: 83 bpm, Respiratory Rate: 16 breaths/min, Blood Pressure: 132/96 mmHg. Eyes conjunctiva clear no eyelid edema noted. pupils equal round and reactive to light and accommodation. Ears, Nose, Mouth, and Throat no gross abnormality of ear auricles or external auditory canals. normal hearing noted during conversation. mucus membranes moist. Respiratory normal breathing without difficulty. clear to auscultation bilaterally. Cardiovascular regular rate and rhythm with normal S1, S2. no clubbing, cyanosis, significant edema, Gastrointestinal (GI) soft, non-tender,  non-distended, +BS. no ventral hernia noted. Shelby Ward, Kaithlyn D. ([id number]) Musculoskeletal normal gait and posture. no significant deformity or arthritic changes, no loss or range of motion, no clubbing. Psychiatric this patient is able to make decisions and demonstrates good insight into disease process. Alert and Oriented x 3. pleasant and cooperative. General Notes: Upon inspection today patient's wound bed did require a fairly significant sharp debridement in order to clear away slough and necrotic material from the surface of the wound. And ended up the wound did actually go deeper it was not until I got into the deeper region that she actually began to experience some difficulty with pain. With that being said I was able to clear away the necrotic tissue as well as a hematoma which had developed and was present in the base of the wound. The patient tolerated all of this today without complication she did have some pain with the debridement but again post debridement it was obvious that the 2 wound openings actually connect 1 to the other. Integumentary (Hair, Skin) Wound #1 status is Open. Original cause of wound was Bite. The wound is located on the Left Forearm. The wound measures 0.8cm length x 3.2cm width x 0.6cm depth; 2.011cm^2 area and 1.206cm^3 volume. There is Fat Layer (Subcutaneous Tissue) Exposed exposed. There is no tunneling or undermining noted. There is a medium amount of serous drainage noted. The wound margin is indistinct and nonvisible. There is small (1-33%) pink granulation within the wound bed. There is a large (67-100%) amount of necrotic tissue within the wound bed including Adherent Slough. Assessment Active Problems ICD-10 Unspecified open wound of left forearm, initial encounter Bitten by dog, initial encounter Non-pressure chronic ulcer of skin of other sites with fat layer exposed Procedures Wound #1 Pre-procedure diagnosis of Wound #1 is a Trauma,  Other located on the Left Forearm . There was a Excisional Skin/Subcutaneous Tissue Debridement with a total area of 2.56 sq cm performed by Shelby Ward. With the following instrument(s): Curette to remove Viable and Non-Viable tissue/material. Material removed includes Subcutaneous Tissue and Slough and. A time out was conducted at 14:22, prior to the start of the procedure. A Minimum amount of bleeding was controlled with Pressure. The procedure was tolerated well. Post Debridement Measurements: 0.8cm length x 3.2cm width x 0.6cm depth; 1.206cm^3 volume. Character of Wound/Ulcer Post Debridement is stable. Post procedure Diagnosis Wound #1: Same as Pre-Procedure Plan Shelby Ward, Shelby D. ([id number]) Wound Cleansing: Wound #1 Left Forearm: Clean wound with Normal Saline. Primary Wound Dressing: Wound #1 Left Forearm: Silver Collagen - packed into wound with 1/4 packing strip behind to hold in place Secondary Dressing: Wound #1 Left Forearm: Boardered Foam Dressing Dressing Change Frequency: Wound #1 Left Forearm: Change dressing every other day. Follow-up Appointments: Wound #1 Left Forearm: Return Appointment in 1 week. 1. At this time I do think it would be appropriate  for the patient to treat this wound in likely a two-stage fashion one would be using collagen over the wound bed in general and then subsequently using a small quarter inch packing strip to hold the collagen in place of the deeper portion of her wound. Hopefully this will reattach and start to granulate in so that we would not have to continue to pack this for too long. She is young and healthy and hopefully should heal appropriately at this point. 2. I am getting suggest that she clean the wound with Dial antibacterial soap which I think is appropriate and safe as well. We again to see her back for reevaluation in 1 week's time to see where things stand the patient is in agreement with the plan. If  anything changes or worsens in the meantime she will contact the office and let me know. Otherwise I am pleased that she seems to be doing so well and I am hopeful that this is going to be something that heals quite nicely and quickly again there is no evidence of active infection she is on antibiotics already so there were no need for additional antibiotics at this point. Electronic Signature(s) Signed: 04/30/2019 5:52:49 PM By: Lenda Kelp Ward Entered By: Lenda Kelp on 04/30/2019 17:52:49 Shelby Dawes ([id number]) -------------------------------------------------------------------------------- ROS/PFSH Details Patient Name: Shelby Phenix D. Date of Service: 04/29/2019 2:15 PM Medical Record Number: [id number] Patient Account Number: [id number] Date of Birth/Sex: 29-Aug-1981 (38 y.o. F) Treating RN: Curtis Sites Primary Care Provider: PATIENT, NO Other Clinician: Referring Provider: Dorothea Glassman Treating Provider/Extender: Shelby III, Ward Weeks in Treatment: 0 Information Obtained From Patient Constitutional Symptoms (General Health) Complaints and Symptoms: Negative for: Fatigue; Fever; Chills; Marked Weight Change Eyes Complaints and Symptoms: Negative for: Dry Eyes; Vision Changes; Glasses / Contacts Medical History: Positive for: Cataracts - left eye Negative for: Glaucoma; Optic Neuritis Past Medical History Notes: retinoschesis Ear/Nose/Mouth/Throat Complaints and Symptoms: Negative for: Difficult clearing ears; Sinusitis Hematologic/Lymphatic Complaints and Symptoms: Negative for: Bleeding / Clotting Disorders; Human Immunodeficiency Virus Respiratory Complaints and Symptoms: Negative for: Chronic or frequent coughs; Shortness of Breath Cardiovascular Complaints and Symptoms: Negative for: Chest pain; LE edema Gastrointestinal Complaints and Symptoms: Negative for: Frequent diarrhea; Nausea; Vomiting Endocrine Complaints and Symptoms: Negative  for: Hepatitis; Thyroid disease; Polydypsia (Excessive Thirst) Genitourinary MALEEAH, CROSSMAN. ([id number]) Complaints and Symptoms: Negative for: Kidney failure/ Dialysis; Incontinence/dribbling Immunological Complaints and Symptoms: Negative for: Hives; Itching Integumentary (Skin) Complaints and Symptoms: Positive for: Wounds - dog bite Negative for: Bleeding or bruising tendency; Breakdown; Swelling Musculoskeletal Complaints and Symptoms: Negative for: Muscle Pain; Muscle Weakness Medical History: Negative for: Gout; Rheumatoid Arthritis; Osteoarthritis Neurologic Complaints and Symptoms: Negative for: Numbness/parasthesias; Focal/Weakness Psychiatric Complaints and Symptoms: Negative for: Anxiety; Claustrophobia HBO Extended History Items Eyes: Cataracts Immunizations Pneumococcal Vaccine: Received Pneumococcal Vaccination: No Implantable Devices None Family and Social History Cancer: Yes - Mother,Maternal Grandparents; Diabetes: Yes - Maternal Grandparents; Heart Disease: No; Hereditary Spherocytosis: No; Hypertension: No; Kidney Disease: No; Lung Disease: Yes - Maternal Grandparents,Siblings; Seizures: No; Stroke: No; Thyroid Problems: No; Tuberculosis: No; Former smoker - 8 years; Marital Status - Married; Alcohol Use: Rarely; Drug Use: No History; Caffeine Use: Daily; Financial Concerns: No; Food, Clothing or Shelter Needs: No; Support System Lacking: No; Transportation Concerns: No Electronic Signature(s) Signed: 04/29/2019 4:10:49 PM By: Curtis Sites Signed: 05/01/2019 12:16:19 AM By: Lenda Kelp Ward Entered By: Curtis Sites on 04/29/2019 14:24:59 Shelby Dawes ([id number]) -------------------------------------------------------------------------------- SuperBill Details Patient Name: Shelby Phenix D. Date  of Service: 04/29/2019 Medical Record Number: [id number] Patient Account Number: [id number] Date of Birth/Sex: 01/20/1981 (38 y.o. F) Treating  RN: Rodell Perna Primary Care Provider: PATIENT, NO Other Clinician: Referring Provider: Dorothea Glassman Treating Provider/Extender: Linwood Dibbles, Ward Weeks in Treatment: 0 Diagnosis Coding ICD-10 Codes Code Description S51.802A Unspecified open wound of left forearm, initial encounter W54.0XXA Bitten by dog, initial encounter [phone] Non-pressure chronic ulcer of skin of other sites with fat layer exposed Facility Procedures CPT4 Code: 21308657 Description: 99213 - WOUND CARE VISIT-LEV 3 EST PT Modifier: Quantity: 1 CPT4 Code: 84696295 Description: 11042 - DEB SUBQ TISSUE 20 SQ CM/< ICD-10 Diagnosis Description L98.492 Non-pressure chronic ulcer of skin of other sites with fat la Modifier: yer exposed Quantity: 1 Physician Procedures CPT4 Code: 2841324 Description: WC PHYS LEVEL 3 o NEW PT ICD-10 Diagnosis Description S51.802A Unspecified open wound of left forearm, initial encounter W54.0XXA Bitten by dog, initial encounter L98.492 Non-pressure chronic ulcer of skin of other sites with fat la Modifier: 25 yer exposed Quantity: 1 CPT4 Code: 4010272 Description: 11042 - WC PHYS SUBQ TISS 20 SQ CM ICD-10 Diagnosis Description L98.492 Non-pressure chronic ulcer of skin of other sites with fat la Modifier: yer exposed Quantity: 1 Electronic Signature(s) Signed: 05/01/2019 12:16:19 AM By: Lenda Kelp Ward Entered By: Lenda Kelp on 04/29/2019 17:19:05

## 2019-05-01 NOTE — Progress Notes (Signed)
MARGARETANN, ABATE ([id number]) Visit Report for 04/29/2019 Allergy List Details Patient Name: LYN, JOENS. Date of Service: 04/29/2019 2:15 PM Medical Record Number: [id number] Patient Account Number: [id number] Date of Birth/Sex: Oct 29, 1980 (38 y.o. F) Treating RN: Montey Hora Primary Care Ariannah Arenson: PATIENT, NO Other Clinician: Referring Tyneshia Stivers: Conni Slipper Treating Akylah Hascall/Extender: STONE III, HOYT Weeks in Treatment: 0 Allergies Active Allergies penicillin Reaction: itching Allergy Notes Electronic Signature(s) Signed: 04/29/2019 4:10:49 PM By: Montey Hora Entered By: Montey Hora on 04/29/2019 14:21:45 Eda Paschal ([id number]) -------------------------------------------------------------------------------- Arrival Information Details Patient Name: [name] Date of Service: 04/29/2019 2:15 PM Medical Record Number: [id number] Patient Account Number: [id number] Date of Birth/Sex: [birth year] (38 y.o. F) Treating RN: Montey Hora Primary Care Bridney Guadarrama: PATIENT, NO Other Clinician: Referring Brenyn Petrey: Conni Slipper Treating Thane Age/Extender: Melburn Hake, HOYT Weeks in Treatment: 0 Visit Information Patient Arrived: Ambulatory Arrival Time: 14:15 Accompanied By: self Transfer Assistance: None Patient Identification Verified: Yes Secondary Verification Process Completed: Yes Electronic Signature(s) Signed: 04/29/2019 4:10:49 PM By: Montey Hora Entered By: Montey Hora on 04/29/2019 14:16:19 Eda Paschal ([id number]) -------------------------------------------------------------------------------- Clinic Level of Care Assessment Details Patient Name: Eda Paschal. Date of Service: 04/29/2019 2:15 PM Medical Record Number: [id number] Patient Account Number: [id number] Date of Birth/Sex: [birth year] (38 y.o. F) Treating RN: Army Melia Primary Care Rejoice Heatwole: PATIENT, NO Other Clinician: Referring Marieta Markov: Conni Slipper Treating  Kerina Simoneau/Extender: Melburn Hake, HOYT Weeks in Treatment: 0 Clinic Level of Care Assessment Items TOOL 1 Quantity Score X - Use when EandM and Procedure is performed on INITIAL visit 1 0 ASSESSMENTS - Nursing Assessment / Reassessment X - General Physical Exam (combine w/ comprehensive assessment (listed just below) when 1 20 performed on new pt. evals) X- 1 25 Comprehensive Assessment (HX, ROS, Risk Assessments, Wounds Hx, etc.) ASSESSMENTS - Wound and Skin Assessment / Reassessment '[]'  - Dermatologic / Skin Assessment (not related to wound area) 0 ASSESSMENTS - Ostomy and/or Continence Assessment and Care '[]'  - Incontinence Assessment and Management 0 '[]'  - 0 Ostomy Care Assessment and Management (repouching, etc.) PROCESS - Coordination of Care X - Simple Patient / Family Education for ongoing care 1 15 '[]'  - 0 Complex (extensive) Patient / Family Education for ongoing care X- 1 10 Staff obtains Programmer, systems, Records, Test Results / Process Orders '[]'  - 0 Staff telephones HHA, Nursing Homes / Clarify orders / etc '[]'  - 0 Routine Transfer to another Facility (non-emergent condition) '[]'  - 0 Routine Hospital Admission (non-emergent condition) X- 1 15 New Admissions / Biomedical engineer / Ordering NPWT, Apligraf, etc. '[]'  - 0 Emergency Hospital Admission (emergent condition) PROCESS - Special Needs '[]'  - Pediatric / Minor Patient Management 0 '[]'  - 0 Isolation Patient Management '[]'  - 0 Hearing / Language / Visual special needs '[]'  - 0 Assessment of Community assistance (transportation, D/C planning, etc.) '[]'  - 0 Additional assistance / Altered mentation '[]'  - 0 Support Surface(s) Assessment (bed, cushion, seat, etc.) RUQAYYA, VENTRESS. ([id number]) INTERVENTIONS - Miscellaneous '[]'  - External ear exam 0 '[]'  - 0 Patient Transfer (multiple staff / Civil Service fast streamer / Similar devices) '[]'  - 0 Simple Staple / Suture removal (25 or less) '[]'  - 0 Complex Staple / Suture removal (26 or more) '[]'   - 0 Hypo/Hyperglycemic Management (do not check if billed separately) '[]'  - 0 Ankle / Brachial Index (ABI) - do not check if billed separately Has the patient been seen at the hospital within the last three years: Yes Total Score: 85 Level Of Care: New/Established - Level 3  Electronic Signature(s) Signed: 04/29/2019 3:30:04 PM By: Army Melia Entered By: Army Melia on 04/29/2019 14:55:03 Eda Paschal ([id number]) -------------------------------------------------------------------------------- Encounter Discharge Information Details Patient Name: Eda Paschal. Date of Service: 04/29/2019 2:15 PM Medical Record Number: [id number] Patient Account Number: [id number] Date of Birth/Sex: 27-Dec-1980 (38 y.o. F) Treating RN: Army Melia Primary Care Latorie Montesano: PATIENT, NO Other Clinician: Referring Delos Klich: Conni Slipper Treating Aquarius Latouche/Extender: Melburn Hake, HOYT Weeks in Treatment: 0 Encounter Discharge Information Items Post Procedure Vitals Discharge Condition: Stable Temperature (F): 98.7 Ambulatory Status: Ambulatory Pulse (bpm): 83 Discharge Destination: Home Respiratory Rate (breaths/min): 16 Transportation: Private Auto Blood Pressure (mmHg): 132/96 Accompanied By: self Schedule Follow-up Appointment: Yes Clinical Summary of Care: Electronic Signature(s) Signed: 04/29/2019 3:30:04 PM By: Army Melia Entered By: Army Melia on 04/29/2019 14:56:45 Eda Paschal ([id number]) -------------------------------------------------------------------------------- Lower Extremity Assessment Details Patient Name: [name] Date of Service: 04/29/2019 2:15 PM Medical Record Number: [id number] Patient Account Number: [id number] Date of Birth/Sex: [birth year] (38 y.o. F) Treating RN: Montey Hora Primary Care Starleen Trussell: PATIENT, NO Other Clinician: Referring Florice Hindle: Conni Slipper Treating Donyetta Ogletree/Extender: Melburn Hake, HOYT Weeks in Treatment: 0 Electronic  Signature(s) Signed: 04/29/2019 4:10:49 PM By: Montey Hora Entered By: Montey Hora on 04/29/2019 14:21:14 Eda Paschal ([id number]) -------------------------------------------------------------------------------- Multi Wound Chart Details Patient Name: [name] Date of Service: 04/29/2019 2:15 PM Medical Record Number: [id number] Patient Account Number: [id number] Date of Birth/Sex: 01-24-1981 (38 y.o. F) Treating RN: Army Melia Primary Care Aster Eckrich: PATIENT, NO Other Clinician: Referring Kea Callan: Conni Slipper Treating Brian Zeitlin/Extender: STONE III, HOYT Weeks in Treatment: 0 Vital Signs Height(in): 69 Pulse(bpm): 83 Weight(lbs): 280 Blood Pressure(mmHg): 132/96 Body Mass Index(BMI): 41 Temperature(F): 98.7 Respiratory Rate 16 (breaths/min): Photos: [N/A:N/A] Wound Location: Left Forearm N/A N/A Wounding Event: Bite N/A N/A Primary Etiology: Trauma, Other N/A N/A Comorbid History: Cataracts N/A N/A Date Acquired: 04/15/2019 N/A N/A Weeks of Treatment: 0 N/A N/A Wound Status: Open N/A N/A Measurements L x W x D 0.8x3.2x0.6 N/A N/A (cm) Area (cm) : 2.011 N/A N/A Volume (cm) : 1.206 N/A N/A Classification: Full Thickness Without N/A N/A Exposed Support Structures Exudate Amount: Medium N/A N/A Exudate Type: Serous N/A N/A Exudate Color: amber N/A N/A Wound Margin: Indistinct, nonvisible N/A N/A Granulation Amount: Small (1-33%) N/A N/A Granulation Quality: Pink N/A N/A Necrotic Amount: Large (67-100%) N/A N/A Exposed Structures: Fat Layer (Subcutaneous N/A N/A Tissue) Exposed: Yes Fascia: No Tendon: No Muscle: No Joint: No Bone: No Epithelialization: None N/A N/A ASHELYNN, MARKS ([id number]) Treatment Notes Electronic Signature(s) Signed: 04/29/2019 3:30:04 PM By: Army Melia Entered By: Army Melia on 04/29/2019 14:44:29 Eda Paschal  ([id number]) -------------------------------------------------------------------------------- Multi-Disciplinary Care Plan Details Patient Name: [name] Date of Service: 04/29/2019 2:15 PM Medical Record Number: [id number] Patient Account Number: [id number] Date of Birth/Sex: [birth year] (37 y.o. F) Treating RN: Army Melia Primary Care Rainah Kirshner: PATIENT, NO Other Clinician: Referring Antwine Agosto: Conni Slipper Treating Jos Cygan/Extender: Melburn Hake, HOYT Weeks in Treatment: 0 Active Inactive Orientation to the Wound Care Program Nursing Diagnoses: Knowledge deficit related to the wound healing center program Goals: Patient/caregiver will verbalize understanding of the [facility] Program Date Initiated: 04/29/2019 Target Resolution Date: 05/13/2019 Goal Status: Active Interventions: Provide education on orientation to the wound center Notes: Pain, Acute or Chronic Nursing Diagnoses: Pain, acute or chronic: actual or potential Goals: Patient/caregiver will verbalize comfort level met Date Initiated: 04/29/2019 Target Resolution Date: 05/20/2019 Goal Status: Active Interventions: Assess comfort goal upon admission Notes: Wound/Skin Impairment Nursing Diagnoses: Impaired tissue integrity  Goals: Ulcer/skin breakdown will have a volume reduction of 30% by week 4 Date Initiated: 04/29/2019 Target Resolution Date: 05/27/2019 Goal Status: Active Interventions: Assess ulceration(s) every visit SANJUANA, MRUK ([id number]) Notes: Electronic Signature(s) Signed: 04/29/2019 3:30:04 PM By: Army Melia Entered By: Army Melia on 04/29/2019 14:43:48 Eda Paschal ([id number]) -------------------------------------------------------------------------------- Pain Assessment Details Patient Name: [name] Date of Service: 04/29/2019 2:15 PM Medical Record Number: [id number] Patient Account Number: [id number] Date of Birth/Sex: 03/10/1981 (38 y.o. F) Treating  RN: Montey Hora Primary Care Tanay Massiah: PATIENT, NO Other Clinician: Referring Javarie Crisp: Conni Slipper Treating Angenette Daily/Extender: Melburn Hake, HOYT Weeks in Treatment: 0 Active Problems Location of Pain Severity and Description of Pain Patient Has Paino No Site Locations Pain Management and Medication Current Pain Management: Electronic Signature(s) Signed: 04/29/2019 4:10:49 PM By: Montey Hora Entered By: Montey Hora on 04/29/2019 14:16:46 Eda Paschal ([id number]) -------------------------------------------------------------------------------- Patient/Caregiver Education Details Patient Name: Eda Paschal. Date of Service: 04/29/2019 2:15 PM Medical Record Number: [id number] Patient Account Number: [id number] Date of Birth/Gender: 07-10-1981 (38 y.o. F) Treating RN: Army Melia Primary Care Physician: PATIENT, NO Other Clinician: Referring Physician: Conni Slipper Treating Physician/Extender: Sharalyn Ink in Treatment: 0 Education Assessment Education Provided To: Patient Education Topics Provided Welcome To [facility]: Handouts: [facility] Methods: Demonstration, Explain/Verbal Responses: State content correctly Wound Debridement: Handouts: Wound Debridement Methods: Demonstration, Explain/Verbal Responses: State content correctly Wound/Skin Impairment: Handouts: Caring for Your Ulcer Methods: Demonstration, Explain/Verbal Responses: State content correctly Electronic Signature(s) Signed: 04/29/2019 3:30:04 PM By: Army Melia Entered By: Army Melia on 04/29/2019 14:55:40 Eda Paschal ([id number]) -------------------------------------------------------------------------------- Wound Assessment Details Patient Name: [name] Date of Service: 04/29/2019 2:15 PM Medical Record Number: [id number] Patient Account Number: [id number] Date of Birth/Sex: 05-Dec-1980 (38 y.o. F) Treating RN: Montey Hora Primary Care Legaci Tarman: PATIENT, NO Other Clinician: Referring [place] Inthavong: Conni Slipper Treating Seymour Pavlak/Extender: STONE III, HOYT Weeks in Treatment: 0 Wound Status Wound Number: 1 Primary Etiology: Trauma, Other Wound Location: Left Forearm Wound Status: Open Wounding Event: Bite Comorbid History: Cataracts Date Acquired: 04/15/2019 Weeks Of Treatment: 0 Clustered Wound: No Photos Wound Measurements Length: (cm) 0.8 Width: (cm) 3.2 Depth: (cm) 0.6 Area: (cm) 2.011 Volume: (cm) 1.206 % Reduction in Area: % Reduction in Volume: Epithelialization: None Tunneling: No Undermining: No Wound Description Full Thickness Without Exposed Support Foul Odo Classification: Structures Slough/F Wound Margin: Indistinct, nonvisible Exudate Medium Amount: Exudate Type: Serous Exudate Color: amber r After Cleansing: No ibrino Yes Wound Bed Granulation Amount: Small (1-33%) Exposed Structure Granulation Quality: Pink Fascia Exposed: No Necrotic Amount: Large (67-100%) Fat Layer (Subcutaneous Tissue) Exposed: Yes Necrotic Quality: Adherent Slough Tendon Exposed: No Muscle Exposed: No Joint Exposed: No Bone Exposed: No Daponte, Jodelle D. ([id number]) Treatment Notes Wound #1 (Left Forearm) Notes prisma, 1/4 packing strip to hold in place, BFD Electronic Signature(s) Signed: 04/29/2019 4:10:49 PM By: Montey Hora Entered By: Montey Hora on 04/29/2019 14:33:35 Eda Paschal ([id number]) -------------------------------------------------------------------------------- Vitals Details Patient Name: [name] Date of Service: 04/29/2019 2:15 PM Medical Record Number: [id number] Patient Account Number: [id number] Date of Birth/Sex: Aug 06, 1981 (37 y.o. F) Treating RN: Montey Hora Primary Care Avion Kutzer: PATIENT, NO Other Clinician: Referring Rosilyn Coachman: Conni Slipper Treating Mako Pelfrey/Extender: STONE III, HOYT Weeks in Treatment: 0 Vital Signs Time Taken:  14:16 Temperature (F): 98.7 Height (in): 69 Pulse (bpm): 83 Source: Measured Respiratory Rate (breaths/min): 16 Weight (lbs): 280 Blood Pressure (mmHg): 132/96 Source: Measured Reference Range: 80 - 120 mg /  dl Body Mass Index (BMI): 41.3 Electronic Signature(s) Signed: 04/29/2019 4:10:49 PM By: Montey Hora Entered By: Montey Hora on 04/29/2019 14:21:05

## 2019-05-06 ENCOUNTER — Encounter: Payer: Self-pay | Admitting: Physician Assistant

## 2019-05-06 ENCOUNTER — Other Ambulatory Visit: Payer: Self-pay

## 2019-05-06 LAB — CULTURE, BLOOD (ROUTINE X 2)
Culture: NO GROWTH
Culture: NO GROWTH
Special Requests: ADEQUATE

## 2019-05-07 NOTE — Progress Notes (Signed)
Shelby Ward, Shelby Ward ([id number]) Visit Report for 05/06/2019 Arrival Information Details Patient Name: Shelby Ward, Shelby Ward. Date of Service: 05/06/2019 8:15 AM Medical Record Number: [id number] Patient Account Number: [id number] Date of Birth/Sex: 1981-10-07 (38 y.o. F) Treating RN: Army Melia Primary Care Anayla Giannetti: PATIENT, NO Other Clinician: Referring Cedrica Brune: Conni Slipper Treating Terrius Gentile/Extender: Melburn Hake, HOYT Weeks in Treatment: 1 Visit Information History Since Last Visit Added or deleted any medications: No Patient Arrived: Ambulatory Any new allergies or adverse reactions: No Arrival Time: 08:21 Had a fall or experienced change in No Accompanied By: self activities of daily living that may affect Transfer Assistance: None risk of falls: Signs or symptoms of abuse/neglect since last visito No Hospitalized since last visit: No Has Dressing in Place as Prescribed: Yes Pain Present Now: No Electronic Signature(s) Signed: 05/06/2019 9:25:34 AM By: Army Melia Entered By: Army Melia on 05/06/2019 08:21:43 Shelby Ward ([id number]) -------------------------------------------------------------------------------- Clinic Level of Care Assessment Details Patient Name: Shelby Coe D. Date of Service: 05/06/2019 8:15 AM Medical Record Number: [id number] Patient Account Number: [id number] Date of Birth/Sex: 12-14-1980 (38 y.o. F) Treating RN: Army Melia Primary Care Terriann Difonzo: PATIENT, NO Other Clinician: Referring Gianny Killman: Conni Slipper Treating Sender Rueb/Extender: Melburn Hake, HOYT Weeks in Treatment: 1 Clinic Level of Care Assessment Items TOOL 4 Quantity Score [] - Use when only an EandM is performed on FOLLOW-UP visit 0 ASSESSMENTS - Nursing Assessment / Reassessment X - Reassessment of Co-morbidities (includes updates in patient status) 1 10 X- 1 5 Reassessment of Adherence to Treatment Plan ASSESSMENTS - Wound and Skin Assessment / Reassessment X - Simple Wound  Assessment / Reassessment - one wound 1 5 [] - 0 Complex Wound Assessment / Reassessment - multiple wounds [] - 0 Dermatologic / Skin Assessment (not related to wound area) ASSESSMENTS - Focused Assessment [] - Circumferential Edema Measurements - multi extremities 0 [] - 0 Nutritional Assessment / Counseling / Intervention [] - 0 Lower Extremity Assessment (monofilament, tuning fork, pulses) [] - 0 Peripheral Arterial Disease Assessment (using hand held doppler) ASSESSMENTS - Ostomy and/or Continence Assessment and Care [] - Incontinence Assessment and Management 0 [] - 0 Ostomy Care Assessment and Management (repouching, etc.) PROCESS - Coordination of Care X - Simple Patient / Family Education for ongoing care 1 15 [] - 0 Complex (extensive) Patient / Family Education for ongoing care [] - 0 Staff obtains Programmer, systems, Records, Test Results / Process Orders [] - 0 Staff telephones HHA, Nursing Homes / Clarify orders / etc [] - 0 Routine Transfer to another Facility (non-emergent condition) [] - 0 Routine Hospital Admission (non-emergent condition) [] - 0 New Admissions / Biomedical engineer / Ordering NPWT, Apligraf, etc. [] - 0 Emergency Hospital Admission (emergent condition) X- 1 10 Simple Discharge Coordination Shelby Ward, BUEHRER. ([id number]) [] - 0 Complex (extensive) Discharge Coordination PROCESS - Special Needs [] - Pediatric / Minor Patient Management 0 [] - 0 Isolation Patient Management [] - 0 Hearing / Language / Visual special needs [] - 0 Assessment of Community assistance (transportation, D/C planning, etc.) [] - 0 Additional assistance / Altered mentation [] - 0 Support Surface(s) Assessment (bed, cushion, seat, etc.) INTERVENTIONS - Wound Cleansing / Measurement X - Simple Wound Cleansing - one wound 1 5 [] - 0 Complex Wound Cleansing - multiple wounds X- 1 5 Wound Imaging (photographs - any number of wounds) [] - 0 Wound Tracing (instead  of photographs) X- 1 5 Simple Wound Measurement - one wound [] - 0 Complex Wound  Measurement - multiple wounds INTERVENTIONS - Wound Dressings [] - Small Wound Dressing one or multiple wounds 0 X- 1 15 Medium Wound Dressing one or multiple wounds [] - 0 Large Wound Dressing one or multiple wounds [] - 0 Application of Medications - topical [] - 0 Application of Medications - injection INTERVENTIONS - Miscellaneous [] - External ear exam 0 [] - 0 Specimen Collection (cultures, biopsies, blood, body fluids, etc.) [] - 0 Specimen(s) / Culture(s) sent or taken to Lab for analysis [] - 0 Patient Transfer (multiple staff / Civil Service fast streamer / Similar devices) [] - 0 Simple Staple / Suture removal (25 or less) [] - 0 Complex Staple / Suture removal (26 or more) [] - 0 Hypo / Hyperglycemic Management (close monitor of Blood Glucose) [] - 0 Ankle / Brachial Index (ABI) - do not check if billed separately X- 1 5 Vital Signs Shelby Ward, Shelby D. ([id number]) Has the patient been seen at the hospital within the last three years: Yes Total Score: 80 Level Of Care: New/Established - Level 3 Electronic Signature(s) Signed: 05/06/2019 9:25:34 AM By: Army Melia Entered By: Army Melia on 05/06/2019 08:46:08 Shelby Ward ([id number]) -------------------------------------------------------------------------------- Encounter Discharge Information Details Patient Name: Shelby Coe D. Date of Service: 05/06/2019 8:15 AM Medical Record Number: [id number] Patient Account Number: [id number] Date of Birth/Sex: November 17, 1980 (38 y.o. F) Treating RN: Army Melia Primary Care Provider: PATIENT, NO Other Clinician: Referring Provider: Conni Slipper Treating Provider/Extender: Melburn Hake, HOYT Weeks in Treatment: 1 Encounter Discharge Information Items Discharge Condition: Stable Ambulatory Status: Ambulatory Discharge Destination: Home Transportation: Private Auto Accompanied By: self Schedule  Follow-up Appointment: Yes Clinical Summary of Care: Electronic Signature(s) Signed: 05/06/2019 9:25:34 AM By: Army Melia Entered By: Army Melia on 05/06/2019 08:47:01 Shelby Ward ([id number]) -------------------------------------------------------------------------------- Lower Extremity Assessment Details Patient Name: Shelby Coe D. Date of Service: 05/06/2019 8:15 AM Medical Record Number: [id number] Patient Account Number: [id number] Date of Birth/Sex: 15-Jun-1981 (38 y.o. F) Treating RN: Army Melia Primary Care Provider: PATIENT, NO Other Clinician: Referring Provider: Conni Slipper Treating Provider/Extender: Melburn Hake, HOYT Weeks in Treatment: 1 Electronic Signature(s) Signed: 05/06/2019 9:25:34 AM By: Army Melia Entered By: Army Melia on 05/06/2019 08:27:29 Shelby Ward ([id number]) -------------------------------------------------------------------------------- Multi Wound Chart Details Patient Name: Shelby Coe D. Date of Service: 05/06/2019 8:15 AM Medical Record Number: [id number] Patient Account Number: [id number] Date of Birth/Sex: 1981-05-23 (38 y.o. F) Treating RN: Army Melia Primary Care Provider: PATIENT, NO Other Clinician: Referring Provider: Conni Slipper Treating Provider/Extender: STONE III, HOYT Weeks in Treatment: 1 Vital Signs Height(in): 69 Pulse(bpm): 75 Weight(lbs): 280 Blood Pressure(mmHg): 170/100 Body Mass Index(BMI): 41 Temperature(F): 98.7 Respiratory Rate 16 (breaths/min): Photos: [N/A:N/A] Wound Location: Left Forearm N/A N/A Wounding Event: Bite N/A N/A Primary Etiology: Trauma, Other N/A N/A Comorbid History: Cataracts N/A N/A Date Acquired: 04/15/2019 N/A N/A Weeks of Treatment: 1 N/A N/A Wound Status: Open N/A N/A Measurements L x W x D 0.4x1.1x0.2 N/A N/A (cm) Area (cm) : 0.346 N/A N/A Volume (cm) : 0.069 N/A N/A % Reduction in Area: 82.80% N/A N/A % Reduction in Volume: 94.30% N/A  N/A Classification: Full Thickness Without N/A N/A Exposed Support Structures Exudate Amount: Medium N/A N/A Exudate Type: Serous N/A N/A Exudate Color: amber N/A N/A Wound Margin: Indistinct, nonvisible N/A N/A Granulation Amount: Medium (34-66%) N/A N/A Granulation Quality: Pink N/A N/A Necrotic Amount: Medium (34-66%) N/A N/A Exposed Structures: Fat Layer (Subcutaneous N/A N/A Tissue) Exposed: Yes Fascia: No Tendon: No Muscle: No Joint: No Bone: No  Shelby Ward, Shelby Ward ([id number]) Epithelialization: None N/A N/A Treatment Notes Electronic Signature(s) Signed: 05/06/2019 9:25:34 AM By: Army Melia Entered By: Army Melia on 05/06/2019 08:40:22 Shelby Ward, Shelby Ward ([id number]) -------------------------------------------------------------------------------- [place] Details Patient Name: Shelby Ward. Date of Service: 05/06/2019 8:15 AM Medical Record Number: [id number] Patient Account Number: [id number] Date of Birth/Sex: May 09, 1981 (38 y.o. F) Treating RN: Army Melia Primary Care Provider: PATIENT, NO Other Clinician: Referring Provider: Conni Slipper Treating Provider/Extender: Melburn Hake, HOYT Weeks in Treatment: 1 Active Inactive Orientation to the Wound Care Program Nursing Diagnoses: Knowledge deficit related to the wound healing center program Goals: Patient/caregiver will verbalize understanding of the [place] Program Date Initiated: 04/29/2019 Target Resolution Date: 05/13/2019 Goal Status: Active Interventions: Provide education on orientation to the wound center Notes: Pain, Acute or Chronic Nursing Diagnoses: Pain, acute or chronic: actual or potential Goals: Patient/caregiver will verbalize comfort level met Date Initiated: 04/29/2019 Target Resolution Date: 05/20/2019 Goal Status: Active Interventions: Assess comfort goal upon admission Notes: Wound/Skin Impairment Nursing Diagnoses: Impaired tissue  integrity Goals: Ulcer/skin breakdown will have a volume reduction of 30% by week 4 Date Initiated: 04/29/2019 Target Resolution Date: 05/27/2019 Goal Status: Active Interventions: Assess ulceration(s) every visit Shelby Ward, Shelby Ward ([id number]) Notes: Electronic Signature(s) Signed: 05/06/2019 9:25:34 AM By: Army Melia Entered By: Army Melia on 05/06/2019 08:40:12 Shelby Ward ([id number]) -------------------------------------------------------------------------------- Pain Assessment Details Patient Name: Shelby Coe D. Date of Service: 05/06/2019 8:15 AM Medical Record Number: [id number] Patient Account Number: [id number] Date of Birth/Sex: 1980/11/05 (38 y.o. F) Treating RN: Army Melia Primary Care Provider: PATIENT, NO Other Clinician: Referring Provider: Conni Slipper Treating Provider/Extender: Melburn Hake, HOYT Weeks in Treatment: 1 Active Problems Location of Pain Severity and Description of Pain Patient Has Paino No Site Locations Pain Management and Medication Current Pain Management: Electronic Signature(s) Signed: 05/06/2019 9:25:34 AM By: Army Melia Entered By: Army Melia on 05/06/2019 08:21:50 Shelby Ward ([id number]) -------------------------------------------------------------------------------- Patient/Caregiver Education Details Patient Name: Shelby Ward. Date of Service: 05/06/2019 8:15 AM Medical Record Number: [id number] Patient Account Number: [id number] Date of Birth/Gender: [birth year] (38 y.o. F) Treating RN: Army Melia Primary Care Physician: PATIENT, NO Other Clinician: Referring Physician: Conni Slipper Treating Physician/Extender: Sharalyn Ink in Treatment: 1 Education Assessment Education Provided To: Patient Education Topics Provided Wound/Skin Impairment: Handouts: Caring for Your Ulcer Methods: Demonstration, Explain/Verbal Responses: State content correctly Electronic Signature(s) Signed: 05/06/2019  9:25:34 AM By: Army Melia Entered By: Army Melia on 05/06/2019 08:46:22 Shelby Ward ([id number]) -------------------------------------------------------------------------------- Wound Assessment Details Patient Name: Shelby Coe D. Date of Service: 05/06/2019 8:15 AM Medical Record Number: [id number] Patient Account Number: [id number] Date of Birth/Sex: 28-Mar-1981 (38 y.o. F) Treating RN: Army Melia Primary Care Provider: PATIENT, NO Other Clinician: Referring Provider: Conni Slipper Treating Provider/Extender: Melburn Hake, HOYT Weeks in Treatment: 1 Wound Status Wound Number: 1 Primary Etiology: Trauma, Other Wound Location: Left Forearm Wound Status: Open Wounding Event: Bite Comorbid History: Cataracts Date Acquired: 04/15/2019 Weeks Of Treatment: 1 Clustered Wound: No Photos Wound Measurements Length: (cm) 0.4 Width: (cm) 1.1 Depth: (cm) 0.2 Area: (cm) 0.346 Volume: (cm) 0.069 % Reduction in Area: 82.8% % Reduction in Volume: 94.3% Epithelialization: None Tunneling: No Undermining: No Wound Description Full Thickness Without Exposed Support Foul Odo Classification: Structures Slough/F Wound Margin: Indistinct, nonvisible Exudate Medium Amount: Exudate Type: Serous Exudate Color: amber r After Cleansing: No ibrino Yes Wound Bed Granulation Amount: Medium (34-66%) Exposed Structure Granulation Quality: Pink Fascia Exposed: No Necrotic Amount: Medium (34-66%) Fat Layer (Subcutaneous  Tissue) Exposed: Yes Necrotic Quality: Adherent Slough Tendon Exposed: No Muscle Exposed: No Joint Exposed: No Bone Exposed: No Shelby Ward, FIUMARA D. ([id number]) Treatment Notes Wound #1 (Left Forearm) Notes prisma, BFD Electronic Signature(s) Signed: 05/06/2019 9:25:34 AM By: Army Melia Entered By: Army Melia on 05/06/2019 08:27:11 Shelby Ward ([id number]) -------------------------------------------------------------------------------- Vitals  Details Patient Name: Shelby Coe D. Date of Service: 05/06/2019 8:15 AM Medical Record Number: [id number] Patient Account Number: [id number] Date of Birth/Sex: 12/07/1980 (38 y.o. F) Treating RN: Army Melia Primary Care Elaysia Devargas: PATIENT, NO Other Clinician: Referring Tramar Brueckner: Conni Slipper Treating Mishka Stegemann/Extender: STONE III, HOYT Weeks in Treatment: 1 Vital Signs Time Taken: 08:21 Temperature (F): 98.7 Height (in): 69 Pulse (bpm): 75 Weight (lbs): 280 Respiratory Rate (breaths/min): 16 Body Mass Index (BMI): 41.3 Blood Pressure (mmHg): 170/100 Reference Range: 80 - 120 mg / dl Electronic Signature(s) Signed: 05/06/2019 9:25:34 AM By: Army Melia Entered By: Army Melia on 05/06/2019 08:22:27

## 2019-05-07 NOTE — Progress Notes (Signed)
Shelby Ward, Shelby D. ([id number]) Visit Report for 05/06/2019 Chief Complaint Document Details Patient Name: Shelby Ward, Shelby D. Date of Service: 05/06/2019 8:15 AM Medical Record Number: [id number] Patient Account Number: [id number] Date of Birth/Sex: 12/03/1980 (38 y.o. F) Treating RN: Rodell PernaScott, Dajea Primary Care Provider: PATIENT, NO Other Clinician: Referring Provider: Dorothea GlassmanMALINDA, PAUL Treating Provider/Extender: Linwood DibblesSTONE III, HOYT Weeks in Treatment: 1 Information Obtained from: Patient Chief Complaint Left forearm dog bite Electronic Signature(s) Signed: 05/06/2019 4:43:29 PM By: Lenda KelpStone III, Hoyt PA-C Entered By: Lenda KelpStone III, Hoyt on 05/06/2019 08:19:28 Shelby Ward, Shelby D. ([id number]) -------------------------------------------------------------------------------- HPI Details Patient Name: Shelby Ward, Shelby D. Date of Service: 05/06/2019 8:15 AM Medical Record Number: [id number] Patient Account Number: [id number] Date of Birth/Sex: 05/04/1981 (38 y.o. F) Treating RN: Rodell PernaScott, Dajea Primary Care Provider: PATIENT, NO Other Clinician: Referring Provider: Dorothea GlassmanMALINDA, PAUL Treating Provider/Extender: Linwood DibblesSTONE III, HOYT Weeks in Treatment: 1 History of Present Illness HPI Description: 04/29/2019 on evaluation today patient actually presents for initial evaluation in our clinic concerning issues that she is having with a wound which is a trauma on the left forearm. This is actually an injury that occurred as a result of being bitten by her dog unfortunately. She was seen in the emergency department where sutures were placed unfortunately not all the sutures took I think there was much more damage internally than was able to be repaired just on a simple suture repair. Nonetheless she does have some slough covering the surface of the wound and she has 2 marks on her arm both of which seem to have some depth to them I believe they likely do connect. Other than this she tells me that she really has been fairly healthy  until just in the past 1-2 months she also dropped a can on her foot and broke 1 of the toes on the left foot that is actually healing she is in a postop shoe for that. She is obviously somewhat frustrated with the situation. 05/06/2019 on evaluation today patient appears to be doing excellent in regard to her left forearm ulcer. She has been tolerating the dressing changes without complication. Fortunately there is no signs of active infection and in fact a lot of undermining that was previously present during her last evaluation has completely resolved. I am very pleased in this regard. She has had a friend that is been helping her to manage this currently. Electronic Signature(s) Signed: 05/06/2019 9:55:16 AM By: Lenda KelpStone III, Hoyt PA-C Entered By: Lenda KelpStone III, Hoyt on 05/06/2019 09:55:15 Shelby Ward, Linnae D. ([id number]) -------------------------------------------------------------------------------- Physical Exam Details Patient Name: Shelby Ward, Shelby D. Date of Service: 05/06/2019 8:15 AM Medical Record Number: [id number] Patient Account Number: [id number] Date of Birth/Sex: 09/09/1981 (38 y.o. F) Treating RN: Rodell PernaScott, Dajea Primary Care Provider: PATIENT, NO Other Clinician: Referring Provider: Dorothea GlassmanMALINDA, PAUL Treating Provider/Extender: STONE III, HOYT Weeks in Treatment: 1 Constitutional Well-nourished and well-hydrated in no acute distress. Respiratory normal breathing without difficulty. clear to auscultation bilaterally. Cardiovascular regular rate and rhythm with normal S1, S2. Psychiatric this patient is able to make decisions and demonstrates good insight into disease process. Alert and Oriented x 3. pleasant and cooperative. Notes Patient's wound VAC currently showed signs of good granulation at this time there did appear to be just a slight amount of undermining on the medial portion of the wound but the good news is this is doing quite well and again I see no signs of any infection  and again I see no evidence that there is anything more significant going on including undermining at  this time. I do not think this is even getting need to be packed anymore. Electronic Signature(s) Signed: 05/06/2019 9:55:56 AM By: Worthy Keeler PA-C Entered By: Worthy Keeler on 05/06/2019 09:55:55 Shelby Ward ([id number]) -------------------------------------------------------------------------------- Physician Orders Details Patient Name: Shelby Coe D. Date of Service: 05/06/2019 8:15 AM Medical Record Number: [id number] Patient Account Number: [id number] Date of Birth/Sex: 07-May-1981 (38 y.o. F) Treating RN: Army Melia Primary Care Provider: PATIENT, NO Other Clinician: Referring Provider: Conni Slipper Treating Provider/Extender: Melburn Hake, HOYT Weeks in Treatment: 1 Verbal / Phone Orders: No Diagnosis Coding ICD-10 Coding Code Description S51.802A Unspecified open wound of left forearm, initial encounter W54.0XXA Bitten by dog, initial encounter [phone] Non-pressure chronic ulcer of skin of other sites with fat layer exposed Wound Cleansing Wound #1 Left Forearm o Clean wound with Normal Saline. Primary Wound Dressing Wound #1 Left Forearm o Silver Collagen Secondary Dressing Wound #1 Left Forearm o Boardered Foam Dressing Dressing Change Frequency Wound #1 Left Forearm o Change dressing every other day. Follow-up Appointments Wound #1 Left Forearm o Return Appointment in 2 weeks. Electronic Signature(s) Signed: 05/06/2019 9:25:34 AM By: Army Melia Signed: 05/06/2019 4:43:29 PM By: Worthy Keeler PA-C Entered By: Army Melia on 05/06/2019 08:45:09 Shelby Ward ([id number]) -------------------------------------------------------------------------------- Problem List Details Patient Name: Shelby Ward, SANTELLI. Date of Service: 05/06/2019 8:15 AM Medical Record Number: [id number] Patient Account Number: [id number] Date of Birth/Sex:  [birth year] (38 y.o. 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 Date of Service: 05/06/2019 8:15 AM Medical Record Number: [id number] Patient Account Number: [id number] Date of Birth/Sex: [birth year] (38 y.o. F) Treating RN: Army Melia Primary Care Provider: PATIENT, NO Other Clinician: Referring Provider: Conni Slipper Treating Provider/Extender: Melburn Hake, HOYT Weeks in Treatment: 1 Subjective Chief Complaint Information obtained from Patient Left forearm dog bite History of Present Illness (HPI) 04/29/2019 on evaluation today patient actually presents for initial evaluation in our clinic concerning issues that she is having with a wound which is a trauma on the left forearm. This is actually an injury that occurred as a result of being bitten by her dog unfortunately. She was seen in the emergency department where sutures were placed unfortunately not all the sutures took I think there was much more damage internally than was able to be repaired just on a simple suture repair. Nonetheless she does have some slough covering the surface of the wound and she has 2 marks on her arm both of which seem  to have some depth to them I believe they likely do connect. Other than this she tells me that she really has been fairly healthy until just in the past 1-2 months she also dropped a can on her foot and broke 1 of the toes on the left foot that is actually healing she is in a postop shoe for that. She is obviously somewhat frustrated with the situation. 05/06/2019 on evaluation today  patient appears to be doing excellent in regard to her left forearm ulcer. She has been tolerating the dressing changes without complication. Fortunately there is no signs of active infection and in fact a lot of undermining that was previously present during her last evaluation has completely resolved. I am very pleased in this regard. She has had a friend that is been helping her to manage this currently. Patient History Information obtained from Patient. Family History Cancer - Mother,Maternal Grandparents, Diabetes - Maternal Grandparents, Lung Disease - Maternal Grandparents,Siblings, No family history of Heart Disease, Hereditary Spherocytosis, Hypertension, Kidney Disease, Seizures, Stroke, Thyroid Problems, Tuberculosis. Social History Former smoker - 8 years, Marital Status - Married, Alcohol Use - Rarely, Drug Use - No History, Caffeine Use - Daily. Medical History Eyes Patient has history of Cataracts - left eye Denies history of Glaucoma, Optic Neuritis Musculoskeletal Denies history of Gout, Rheumatoid Arthritis, Osteoarthritis Medical And Surgical History Notes Eyes retinoschesis Review of Systems (ROS) Respiratory Denies complaints or symptoms of Chronic or frequent coughs, Shortness of Breath. Cardiovascular Shelby Ward, Hennie D. ([id number]) Denies complaints or symptoms of Chest pain, LE edema. Psychiatric Denies complaints or symptoms of Anxiety, Claustrophobia. Objective Constitutional Well-nourished and well-hydrated in no acute distress. Vitals Time Taken: 8:21 AM, Height: 69 in,  Weight: 280 lbs, BMI: 41.3, Temperature: 98.7 F, Pulse: 75 bpm, Respiratory Rate: 16 breaths/min, Blood Pressure: 170/100 mmHg. Respiratory normal breathing without difficulty. clear to auscultation bilaterally. Cardiovascular regular rate and rhythm with normal S1, S2. Psychiatric this patient is able to make decisions and demonstrates good insight into disease process. Alert and Oriented x 3. pleasant and cooperative. General Notes: Patient's wound VAC currently showed signs of good granulation at this time there did appear to be just a slight amount of undermining on the medial portion of the wound but the good news is this is doing quite well and again I see no signs of any infection and again I see no evidence that there is anything more significant going on including undermining at this time. I do not think this is even getting need to be packed anymore. Integumentary (Hair, Skin) Wound #1 status is Open. Original cause of wound was Bite. The wound is located on the Left Forearm. The wound measures 0.4cm length x 1.1cm width x 0.2cm depth; 0.346cm^2 area and 0.069cm^3 volume. There is Fat Layer (Subcutaneous Tissue) Exposed exposed. There is no tunneling or undermining noted. There is a medium amount of serous drainage noted. The wound margin is indistinct and nonvisible. There is medium (34-66%) pink granulation within the wound bed. There is a medium (34-66%) amount of necrotic tissue within the wound bed including Adherent Slough. Assessment Active Problems ICD-10 Unspecified open wound of left forearm, initial encounter Bitten by dog, initial encounter Non-pressure chronic ulcer of skin of other sites with fat layer exposed Shelby Ward, Shelby D. ([id number]) Plan Wound Cleansing: Wound #1 Left Forearm: Clean wound with Normal Saline. Primary Wound Dressing: Wound #1 Left Forearm: Silver Collagen Secondary Dressing: Wound #1 Left Forearm: Boardered Foam Dressing Dressing  Change Frequency: Wound #1 Left Forearm: Change dressing every other day. Follow-up Appointments: Wound #1 Left Forearm: Return Appointment in 2 weeks. 1. I am going to continue with the collagen dressing as this seems to be beneficial for the patient she is in agreement with the plan. 2. High also suggest that she continue to monitor for any signs or symptoms of infection she is done with her antibiotic at this time I do not believe there  is any need for us to repeat or extend antibiotic therapy based on what I am seeing. Patient is in agreement and actually happy to hear this. We will see patient back for reevaluation in 2 weeks here in the clinic. If anything worsens or changes patient will contact our office for additional recommendations. Electronic Signature(s) Signed: 05/06/2019 9:56:28 AM By: Lenda KelpStone III, Hoyt PA-C Entered By: Lenda KelpStone III, Hoyt on 05/06/2019 09:56:27 Shelby Ward, Indea D. ([id number]) -------------------------------------------------------------------------------- ROS/PFSH Details Patient Name: Shelby Ward, Shelby Ward D. Date of Service: 05/06/2019 8:15 AM Medical Record Number: [id number] Patient Account Number: [id number] Date of Birth/Sex: 08/13/1981 (38 y.o. F) Treating RN: Rodell PernaScott, Dajea Primary Care Provider: PATIENT, NO Other Clinician: Referring Provider: Dorothea GlassmanMALINDA, PAUL Treating Provider/Extender: Linwood DibblesSTONE III, HOYT Weeks in Treatment: 1 Information Obtained From Patient Respiratory Complaints and Symptoms: Negative for: Chronic or frequent coughs; Shortness of Breath Cardiovascular Complaints and Symptoms: Negative for: Chest pain; LE edema Psychiatric Complaints and Symptoms: Negative for: Anxiety; Claustrophobia Eyes Medical History: Positive for: Cataracts - left eye Negative for: Glaucoma; Optic Neuritis Past Medical History Notes: retinoschesis Musculoskeletal Medical History: Negative for: Gout; Rheumatoid Arthritis; Osteoarthritis HBO Extended History  Items Eyes: Cataracts Immunizations Pneumococcal Vaccine: Received Pneumococcal Vaccination: No Implantable Devices None Family and Social History Cancer: Yes - Mother,Maternal Grandparents; Diabetes: Yes - Maternal Grandparents; Heart Disease: No; Hereditary Spherocytosis: No; Hypertension: No; Kidney Disease: No; Lung Disease: Yes - Maternal Grandparents,Siblings; Seizures: No; Stroke: No; Thyroid Problems: No; Tuberculosis: No; Former smoker - 8 years; Marital Status - Married; Alcohol Use: Rarely; Drug Use: No History; Caffeine Use: Daily; Financial Concerns: No; Food, Clothing or Shelter Needs: No; Support System Lacking: No; Transportation Concerns: No Shelby Ward, Adena D. ([id number]) Physician Affirmation I have reviewed and agree with the above information. Electronic Signature(s) Signed: 05/06/2019 4:19:55 PM By: Rodell PernaScott, Dajea Signed: 05/06/2019 4:43:29 PM By: Lenda KelpStone III, Hoyt PA-C Entered By: Lenda KelpStone III, Hoyt on 05/06/2019 09:55:41 Shelby Ward, Geanette D. ([id number]) -------------------------------------------------------------------------------- SuperBill Details Patient Name: Shelby Ward, Trany D. Date of Service: 05/06/2019 Medical Record Number: [id number] Patient Account Number: [id number] Date of Birth/Sex: 10/22/1980 (38 y.o. F) Treating RN: Rodell PernaScott, Dajea Primary Care Provider: PATIENT, NO Other Clinician: Referring Provider: Dorothea GlassmanMALINDA, PAUL Treating Provider/Extender: Linwood DibblesSTONE III, HOYT Weeks in Treatment: 1 Diagnosis Coding ICD-10 Codes Code Description S51.802A Unspecified open wound of left forearm, initial encounter W54.0XXA Bitten by dog, initial encounter [phone] Non-pressure chronic ulcer of skin of other sites with fat layer exposed Facility Procedures CPT4 Code: [id number] Description: 99213 - WOUND CARE VISIT-LEV 3 EST PT Modifier: Quantity: 1 Physician Procedures CPT4 Code: [id number] Description: 99213 - WC PHYS LEVEL 3 - EST PT ICD-10 Diagnosis Description S51.802A  Unspecified open wound of left forearm, initial encounter W54.0XXA Bitten by dog, initial encounter [phone] Non-pressure chronic ulcer of skin of other sites with fat l Modifier: ayer exposed Quantity: 1 Electronic Signature(s) Signed: 05/06/2019 9:56:45 AM By: Lenda KelpStone III, Hoyt PA-C Entered By: Lenda KelpStone III, Hoyt on 05/06/2019 09:56:45

## 2019-05-17 ENCOUNTER — Encounter: Payer: Self-pay | Attending: Physician Assistant | Admitting: Physician Assistant

## 2019-05-17 ENCOUNTER — Other Ambulatory Visit: Payer: Self-pay

## 2019-05-17 DIAGNOSIS — L98492 Non-pressure chronic ulcer of skin of other sites with fat layer exposed: Secondary | ICD-10-CM | POA: Insufficient documentation

## 2019-05-17 DIAGNOSIS — S51852A Open bite of left forearm, initial encounter: Secondary | ICD-10-CM | POA: Insufficient documentation

## 2019-05-17 DIAGNOSIS — Z09 Encounter for follow-up examination after completed treatment for conditions other than malignant neoplasm: Secondary | ICD-10-CM | POA: Insufficient documentation

## 2019-05-17 DIAGNOSIS — W540XXA Bitten by dog, initial encounter: Secondary | ICD-10-CM | POA: Insufficient documentation

## 2019-05-17 DIAGNOSIS — Z87891 Personal history of nicotine dependence: Secondary | ICD-10-CM | POA: Insufficient documentation

## 2019-05-17 NOTE — Progress Notes (Signed)
Shelby Ward, Andreika D. ([id number]) Visit Report for 05/17/2019 Arrival Information Details Patient Name: Shelby Ward, Shelby D. Date of Service: 05/17/2019 12:45 PM Medical Record Number: [id number] Patient Account Number: [id number] Date of Birth/Sex: 08/01/1981 (38 y.o. F) Treating RN: Arnette NorrisBiell, Kristina Primary Care Laruen Risser: PATIENT, NO Other Clinician: Referring Raeven Pint: Dorothea GlassmanMALINDA, PAUL Treating [place]/Extender: Linwood DibblesSTONE III, HOYT Weeks in Treatment: 2 Visit Information History Since Last Visit Added or deleted any medications: No Patient Arrived: Ambulatory Any new allergies or adverse reactions: No Arrival Time: 12:51 Had a fall or experienced change in No Accompanied By: self activities of daily living that may affect Transfer Assistance: None risk of falls: Patient Identification Verified: Yes Signs or symptoms of abuse/neglect since last visito No Secondary Verification Process Completed: Yes Hospitalized since last visit: No Implantable device outside of the clinic excluding No cellular tissue based products placed in the center since last visit: Has Dressing in Place as Prescribed: No Pain Present Now: No Electronic Signature(s) Signed: 05/17/2019 4:33:12 PM By: Dayton MartesWallace, RCP,RRT,CHT, Sallie RCP, RRT, CHT Entered By: Dayton MartesWallace, RCP,RRT,CHT, Sallie on 05/17/2019 12:52:00 Shelby DawesARROLL, Emorie D. ([id number]) -------------------------------------------------------------------------------- Clinic Level of Care Assessment Details Patient Name: Shelby Ward, Shelby D. Date of Service: 05/17/2019 12:45 PM Medical Record Number: [id number] Patient Account Number: [id number] Date of Birth/Sex: 12/16/1980 (38 y.o. F) Treating RN: Arnette NorrisBiell, Kristina Primary Care Gianne Shugars: PATIENT, NO Other Clinician: Referring Issam Carlyon: Dorothea GlassmanMALINDA, PAUL Treating Durrell Barajas/Extender: Linwood DibblesSTONE III, HOYT Weeks in Treatment: 2 Clinic Level of Care Assessment Items TOOL 4 Quantity Score []  - Use when only an EandM is performed on  FOLLOW-UP visit 0 ASSESSMENTS - Nursing Assessment / Reassessment X - Reassessment of Co-morbidities (includes updates in patient status) 1 10 X- 1 5 Reassessment of Adherence to Treatment Plan ASSESSMENTS - Wound and Skin Assessment / Reassessment X - Simple Wound Assessment / Reassessment - one wound 1 5 []  - 0 Complex Wound Assessment / Reassessment - multiple wounds []  - 0 Dermatologic / Skin Assessment (not related to wound area) ASSESSMENTS - Focused Assessment []  - Circumferential Edema Measurements - multi extremities 0 []  - 0 Nutritional Assessment / Counseling / Intervention []  - 0 Lower Extremity Assessment (monofilament, tuning fork, pulses) []  - 0 Peripheral Arterial Disease Assessment (using hand held doppler) ASSESSMENTS - Ostomy and/or Continence Assessment and Care []  - Incontinence Assessment and Management 0 []  - 0 Ostomy Care Assessment and Management (repouching, etc.) PROCESS - Coordination of Care X - Simple Patient / Family Education for ongoing care 1 15 []  - 0 Complex (extensive) Patient / Family Education for ongoing care []  - 0 Staff obtains ChiropractorConsents, Records, Test Results / Process Orders []  - 0 Staff telephones HHA, Nursing Homes / Clarify orders / etc []  - 0 Routine Transfer to another Facility (non-emergent condition) []  - 0 Routine Hospital Admission (non-emergent condition) []  - 0 New Admissions / Manufacturing engineernsurance Authorizations / Ordering NPWT, Apligraf, etc. []  - 0 Emergency Hospital Admission (emergent condition) X- 1 10 Simple Discharge Coordination Shelby Ward, Zyra D. ([id number]) []  - 0 Complex (extensive) Discharge Coordination PROCESS - Special Needs []  - Pediatric / Minor Patient Management 0 []  - 0 Isolation Patient Management []  - 0 Hearing / Language / Visual special needs []  - 0 Assessment of Community assistance (transportation, D/C planning, etc.) []  - 0 Additional assistance / Altered mentation []  - 0 Support Surface(s)  Assessment (bed, cushion, seat, etc.) INTERVENTIONS - Wound Cleansing / Measurement X - Simple Wound Cleansing - one wound 1 5 []  - 0 Complex Wound Cleansing - multiple wounds  X- 1 5 Wound Imaging (photographs - any number of wounds) []  - 0 Wound Tracing (instead of photographs) X- 1 5 Simple Wound Measurement - one wound []  - 0 Complex Wound Measurement - multiple wounds INTERVENTIONS - Wound Dressings X - Small Wound Dressing one or multiple wounds 1 10 []  - 0 Medium Wound Dressing one or multiple wounds []  - 0 Large Wound Dressing one or multiple wounds []  - 0 Application of Medications - topical []  - 0 Application of Medications - injection INTERVENTIONS - Miscellaneous []  - External ear exam 0 []  - 0 Specimen Collection (cultures, biopsies, blood, body fluids, etc.) []  - 0 Specimen(s) / Culture(s) sent or taken to Lab for analysis []  - 0 Patient Transfer (multiple staff / Civil Service fast streamer / Similar devices) []  - 0 Simple Staple / Suture removal (25 or less) []  - 0 Complex Staple / Suture removal (26 or more) []  - 0 Hypo / Hyperglycemic Management (close monitor of Blood Glucose) []  - 0 Ankle / Brachial Index (ABI) - do not check if billed separately X- 1 5 Vital Signs Lunt, Romanita D. ([id number]) Has the patient been seen at the hospital within the last three years: Yes Total Score: 75 Level Of Care: New/Established - Level 2 Electronic Signature(s) Signed: 05/17/2019 4:21:56 PM By: Harold Barban Entered By: Harold Barban on 05/17/2019 13:12:32 Eda Paschal ([id number]) -------------------------------------------------------------------------------- Encounter Discharge Information Details Patient Name: Shelby Coe D. Date of Service: 05/17/2019 12:45 PM Medical Record Number: [id number] Patient Account Number: [id number] Date of Birth/Sex: 1981/03/23 (38 y.o. F) Treating RN: Harold Barban Primary Care Moody Robben: PATIENT, NO Other Clinician: Referring  Demtrius Rounds: Conni Slipper Treating Lakaisha Danish/Extender: Melburn Hake, HOYT Weeks in Treatment: 2 Encounter Discharge Information Items Discharge Condition: Stable Ambulatory Status: Ambulatory Discharge Destination: Home Transportation: Private Auto Accompanied By: self Schedule Follow-up Appointment: Yes Clinical Summary of Care: Electronic Signature(s) Signed: 05/17/2019 4:21:56 PM By: Harold Barban Entered By: Harold Barban on 05/17/2019 13:13:58 Eda Paschal ([id number]) -------------------------------------------------------------------------------- Lower Extremity Assessment Details Patient Name: Shelby Coe D. Date of Service: 05/17/2019 12:45 PM Medical Record Number: [id number] Patient Account Number: [id number] Date of Birth/Sex: 12/01/1980 (38 y.o. F) Treating RN: Cornell Barman Primary Care Kelden Lavallee: PATIENT, NO Other Clinician: Referring Dianey Suchy: Conni Slipper Treating Cashe Gatt/Extender: Sharalyn Ink in Treatment: 2 Electronic Signature(s) Signed: 05/17/2019 5:04:06 PM By: Gretta Cool, BSN, RN, CWS, Kim RN, BSN Entered By: Gretta Cool, BSN, RN, CWS, Kim on 05/17/2019 12:59:31 Eda Paschal ([id number]) -------------------------------------------------------------------------------- Multi Wound Chart Details Patient Name: Eda Paschal. Date of Service: 05/17/2019 12:45 PM Medical Record Number: [id number] Patient Account Number: [id number] Date of Birth/Sex: 1981/03/06 (38 y.o. F) Treating RN: Harold Barban Primary Care Maninder Deboer: PATIENT, NO Other Clinician: Referring Marci Polito: Conni Slipper Treating Kellyn Mansfield/Extender: STONE III, HOYT Weeks in Treatment: 2 Vital Signs Height(in): 69 Pulse(bpm): 86 Weight(lbs): 280 Blood Pressure(mmHg): 160/94 Body Mass Index(BMI): 41 Temperature(F): 98.7 Respiratory Rate 16 (breaths/min): Photos: [N/A:N/A] Wound Location: Left Forearm N/A N/A Wounding Event: Bite N/A N/A Primary Etiology: Trauma, Other N/A  N/A Comorbid History: Cataracts N/A N/A Date Acquired: 04/15/2019 N/A N/A Weeks of Treatment: 2 N/A N/A Wound Status: Healed - Epithelialized N/A N/A Measurements L x W x D 0x0x0 N/A N/A (cm) Area (cm) : 0 N/A N/A Volume (cm) : 0 N/A N/A % Reduction in Area: 100.00% N/A N/A % Reduction in Volume: 100.00% N/A N/A Classification: Full Thickness Without N/A N/A Exposed Support Structures Exudate Amount: None Present N/A N/A Wound Margin: Indistinct, nonvisible N/A N/A Granulation Amount:  None Present (0%) N/A N/A Necrotic Amount: Large (67-100%) N/A N/A Necrotic Tissue: Eschar N/A N/A Exposed Structures: Fascia: No N/A N/A Fat Layer (Subcutaneous Tissue) Exposed: No Tendon: No Muscle: No Joint: No Bone: No Epithelialization: Large (67-100%) N/A N/A Shelby Ward, Trust D. ([id number]) Treatment Notes Electronic Signature(s) Signed: 05/17/2019 4:21:56 PM By: Arnette NorrisBiell, Kristina Entered By: Arnette NorrisBiell, Kristina on 05/17/2019 13:11:48 Shelby DawesARROLL, Haidynn D. ([id number]) -------------------------------------------------------------------------------- Multi-Disciplinary Care Plan Details Patient Name: Shelby Ward, Rainn D. Date of Service: 05/17/2019 12:45 PM Medical Record Number: [id number] Patient Account Number: [id number] Date of Birth/Sex: 07/03/1981 (38 y.o. F) Treating RN: Arnette NorrisBiell, Kristina Primary Care Malekai Markwood: PATIENT, NO Other Clinician: Referring Zaidy Absher: Dorothea GlassmanMALINDA, PAUL Treating [place]/Extender: Linwood DibblesSTONE III, HOYT Weeks in Treatment: 2 Active Inactive Electronic Signature(s) Signed: 05/17/2019 4:21:56 PM By: Arnette NorrisBiell, Kristina Entered By: Arnette NorrisBiell, Kristina on 05/17/2019 15:18:35 Shelby DawesARROLL, Mailey D. ([id number]) -------------------------------------------------------------------------------- Pain Assessment Details Patient Name: Shelby Ward, Donnita D. Date of Service: 05/17/2019 12:45 PM Medical Record Number: [id number] Patient Account Number: [id number] Date of Birth/Sex: 06/21/1981 (38 y.o.  F) Treating RN: Arnette NorrisBiell, Kristina Primary Care Rakim Moone: PATIENT, NO Other Clinician: Referring Lazette Estala: Dorothea GlassmanMALINDA, PAUL Treating Jamile Sivils/Extender: Linwood DibblesSTONE III, HOYT Weeks in Treatment: 2 Active Problems Location of Pain Severity and Description of Pain Patient Has Paino No Site Locations Pain Management and Medication Current Pain Management: Electronic Signature(s) Signed: 05/17/2019 4:21:56 PM By: Arnette NorrisBiell, Kristina Signed: 05/17/2019 4:33:12 PM By: Dayton MartesWallace, RCP,RRT,CHT, Sallie RCP, RRT, CHT Entered By: Dayton MartesWallace, RCP,RRT,CHT, Sallie on 05/17/2019 12:52:09 Shelby DawesARROLL, Joleena D. ([id number]) -------------------------------------------------------------------------------- Patient/Caregiver Education Details Patient Name: Shelby DawesARROLL, Vaneza D. Date of Service: 05/17/2019 12:45 PM Medical Record Number: [id number] Patient Account Number: [id number] Date of Birth/Gender: 10/26/1980 (38 y.o. F) Treating RN: Arnette NorrisBiell, Kristina Primary Care Physician: PATIENT, NO Other Clinician: Referring Physician: Dorothea GlassmanMALINDA, PAUL Treating Physician/Extender: Skeet SimmerSTONE III, HOYT Weeks in Treatment: 2 Education Assessment Education Provided To: Patient Education Topics Provided Wound/Skin Impairment: Handouts: Caring for Your Ulcer Methods: Demonstration, Explain/Verbal Responses: State content correctly Electronic Signature(s) Signed: 05/17/2019 4:21:56 PM By: Arnette NorrisBiell, Kristina Entered By: Arnette NorrisBiell, Kristina on 05/17/2019 13:12:03 Shelby DawesARROLL, Anamaria D. ([id number]) -------------------------------------------------------------------------------- Wound Assessment Details Patient Name: Shelby Ward, Risha D. Date of Service: 05/17/2019 12:45 PM Medical Record Number: [id number] Patient Account Number: [id number] Date of Birth/Sex: 06/23/1981 (38 y.o. F) Treating RN: Arnette NorrisBiell, Kristina Primary Care Ta Fair: PATIENT, NO Other Clinician: Referring Nickolai Rinks: Dorothea GlassmanMALINDA, PAUL Treating Kanani Mowbray/Extender: Linwood DibblesSTONE III, HOYT Weeks in Treatment: 2 Wound  Status Wound Number: 1 Primary Etiology: Trauma, Other Wound Location: Left Forearm Wound Status: Healed - Epithelialized Wounding Event: Bite Comorbid History: Cataracts Date Acquired: 04/15/2019 Weeks Of Treatment: 2 Clustered Wound: No Photos Wound Measurements Length: (cm) 0 % Reduct Width: (cm) 0 % Reduct Depth: (cm) 0 Epitheli Area: (cm) 0 Tunneli Volume: (cm) 0 Undermi ion in Area: 100% ion in Volume: 100% alization: Large (67-100%) ng: No ning: No Wound Description Full Thickness Without Exposed Support Foul Odo Classification: Structures Slough/F Wound Margin: Indistinct, nonvisible Exudate None Present Amount: r After Cleansing: No ibrino No Wound Bed Granulation Amount: None Present (0%) Exposed Structure Necrotic Amount: Large (67-100%) Fascia Exposed: No Necrotic Quality: Eschar Fat Layer (Subcutaneous Tissue) Exposed: No Tendon Exposed: No Muscle Exposed: No Joint Exposed: No Bone Exposed: No Electronic Signature(s) Signed: 05/17/2019 4:21:56 PM By: Ardath SaxBiell, Kristina Abello, Ember D. ([id number]) Entered By: Arnette NorrisBiell, Kristina on 05/17/2019 13:11:30 Shelby DawesARROLL, Jonie D. ([id number]) -------------------------------------------------------------------------------- Vitals Details Patient Name: Shelby Ward, Rabecka D. Date of Service: 05/17/2019 12:45 PM Medical Record Number: [id number] Patient Account Number: [id number] Date of Birth/Sex: 04/15/1981 (38 y.o. F) Treating RN: Arnette NorrisBiell, Kristina  Primary Care Arel Tippen: PATIENT, NO Other Clinician: Referring Aoi Kouns: Dorothea GlassmanMALINDA, PAUL Treating Saree Krogh/Extender: STONE III, HOYT Weeks in Treatment: 2 Vital Signs Time Taken: 12:50 Temperature (F): 98.7 Height (in): 69 Pulse (bpm): 86 Weight (lbs): 280 Respiratory Rate (breaths/min): 16 Body Mass Index (BMI): 41.3 Blood Pressure (mmHg): 160/94 Reference Range: 80 - 120 mg / dl Electronic Signature(s) Signed: 05/17/2019 4:33:12 PM By: Dayton MartesWallace, RCP,RRT,CHT, Sallie  RCP, RRT, CHT Entered By: Dayton MartesWallace, RCP,RRT,CHT, Sallie on 05/17/2019 12:52:41

## 2019-05-17 NOTE — Progress Notes (Addendum)
AMBERT, VIRRUETA ([id number]) Visit Report for 05/17/2019 Chief Complaint Document Details Patient Name: Shelby Ward, Shelby Ward. Date of Service: 05/17/2019 12:45 PM Medical Record Number: [id number] Patient Account Number: [id number] Date of Birth/Sex: June 19, 1981 (38 y.o. F) Treating RN: Harold Barban Primary Care Provider: PATIENT, NO Other Clinician: Referring Provider: Conni Slipper Treating Provider/Extender: Melburn Hake, HOYT Weeks in Treatment: 2 Information Obtained from: Patient Chief Complaint Left forearm dog bite Electronic Signature(s) Signed: 05/17/2019 1:09:22 PM By: Worthy Keeler PA-C Entered By: Worthy Keeler on 05/17/2019 13:09:22 Shelby Ward ([id number]) -------------------------------------------------------------------------------- HPI Details Patient Name: Shelby Ward. Date of Service: 05/17/2019 12:45 PM Medical Record Number: [id number] Patient Account Number: [id number] Date of Birth/Sex: [birth year] (38 y.o. F) Treating RN: Harold Barban Primary Care Provider: PATIENT, NO Other Clinician: Referring Provider: Conni Slipper Treating Provider/Extender: Melburn Hake, HOYT Weeks in Treatment: 2 History of Present Illness HPI Description: 04/29/2019 on evaluation today patient actually presents for initial evaluation in our clinic concerning issues that she is having with a wound which is a trauma on the left forearm. This is actually an injury that occurred as a result of being bitten by her dog unfortunately. She was seen in the emergency department where sutures were placed unfortunately not all the sutures took I think there was much more damage internally than was able to be repaired just on a simple suture repair. Nonetheless she does have some slough covering the surface of the wound and she has 2 marks on her arm both of which seem to have some depth to them I believe they likely do connect. Other than this she tells me that she really has been fairly  healthy until just in the past 1-2 months she also dropped a can on her foot and broke 1 of the toes on the left foot that is actually healing she is in a postop shoe for that. She is obviously somewhat frustrated with the situation. 05/06/2019 on evaluation today patient appears to be doing excellent in regard to her left forearm ulcer. She has been tolerating the dressing changes without complication. Fortunately there is no signs of active infection and in fact a lot of undermining that was previously present during her last evaluation has completely resolved. I am very pleased in this regard. She has had a friend that is been helping her to manage this currently. 05/17/2019 upon evaluation today patient actually appears to be doing well with regard to her left forearm ulcer. She has been tolerating the dressing changes without complication. Fortunately there is no signs of active infection at this time. No fevers, chills, nausea, vomiting, or diarrhea. She is having no discomfort. Electronic Signature(s) Signed: 05/17/2019 1:35:06 PM By: Worthy Keeler PA-C Entered By: Worthy Keeler on 05/17/2019 13:35:06 Shelby Ward ([id number]) -------------------------------------------------------------------------------- Physical Exam Details Patient Name: Shelby Ward, Shelby Ward. Date of Service: 05/17/2019 12:45 PM Medical Record Number: [id number] Patient Account Number: [id number] Date of Birth/Sex: Feb 26, 1981 (38 y.o. F) Treating RN: Harold Barban Primary Care Provider: PATIENT, NO Other Clinician: Referring Provider: Conni Slipper Treating Provider/Extender: STONE III, HOYT Weeks in Treatment: 2 Constitutional Well-nourished and well-hydrated in no acute distress. Respiratory normal breathing without difficulty. Psychiatric this patient is able to make decisions and demonstrates good insight into disease process. Alert and Oriented x 3. pleasant and cooperative. Notes Currently patient's  wound bed actually showed signs of excellent epithelialization and in fact appears to be completely close she tells me that the dressing material she has been putting  on this which was the Prisma is no longer even sticking to it because it is doing so well. Obviously this is because it is healed. Electronic Signature(s) Signed: 05/17/2019 1:35:37 PM By: Lenda KelpStone III, Hoyt PA-C Entered By: Lenda KelpStone III, Hoyt on 05/17/2019 13:35:36 Shelby Ward, Shelby D. ([id number]) -------------------------------------------------------------------------------- Physician Orders Details Patient Name: Shelby Ward, Shelby D. Date of Service: 05/17/2019 12:45 PM Medical Record Number: [id number] Patient Account Number: [id number] Date of Birth/Sex: 06/19/1981 (38 y.o. F) Treating RN: Arnette NorrisBiell, Kristina Primary Care Provider: PATIENT, NO Other Clinician: Referring Provider: Dorothea GlassmanMALINDA, PAUL Treating Provider/Extender: Linwood DibblesSTONE III, HOYT Weeks in Treatment: 2 Verbal / Phone Orders: No Diagnosis Coding ICD-10 Coding Code Description S51.802A Unspecified open wound of left forearm, initial encounter W54.0XXA Bitten by dog, initial encounter [phone] Non-pressure chronic ulcer of skin of other sites with fat layer exposed [facility] Discharge from [facility] Center - Call with any questions and concerns Electronic Signature(s) Signed: 05/17/2019 4:21:56 PM By: Arnette NorrisBiell, Kristina Signed: 05/17/2019 5:48:01 PM By: Lenda KelpStone III, Hoyt PA-C Entered By: Arnette NorrisBiell, Kristina on 05/17/2019 13:13:20 Shelby Ward, Shelby D. ([id number]) -------------------------------------------------------------------------------- Problem List Details Patient Name: Shelby Ward, Yaretsi D. Date of Service: 05/17/2019 12:45 PM Medical Record Number: [id number] Patient Account Number: [id number] Date of Birth/Sex: 02/07/1981 (38 y.o. F) Treating RN: Arnette NorrisBiell, Kristina Primary Care Provider: PATIENT, NO Other Clinician: Referring Provider: Dorothea GlassmanMALINDA, PAUL Treating  Provider/Extender: Linwood DibblesSTONE III, HOYT Weeks in Treatment: 2 Active Problems ICD-10 Evaluated Encounter Code Description Active Date Today Diagnosis S51.802A Unspecified open wound of left forearm, initial encounter 04/29/2019 No Yes W54.0XXA Bitten by dog, initial encounter 04/29/2019 No Yes L98.492 Non-pressure chronic ulcer of skin of other sites with fat layer 04/29/2019 No Yes exposed Inactive Problems Resolved Problems Electronic Signature(s) Signed: 05/17/2019 1:09:16 PM By: Lenda KelpStone III, Hoyt PA-C Entered By: Lenda KelpStone III, Hoyt on 05/17/2019 13:09:15 Shelby Ward, Runette D. ([id number]) -------------------------------------------------------------------------------- Progress Note Details Patient Name: Shelby Ward, Arcadia D. Date of Service: 05/17/2019 12:45 PM Medical Record Number: [id number] Patient Account Number: [id number] Date of Birth/Sex: 01/15/1981 (38 y.o. F) Treating RN: Arnette NorrisBiell, Kristina Primary Care Provider: PATIENT, NO Other Clinician: Referring Provider: Dorothea GlassmanMALINDA, PAUL Treating Provider/Extender: Linwood DibblesSTONE III, HOYT Weeks in Treatment: 2 Subjective Chief Complaint Information obtained from Patient Left forearm dog bite History of Present Illness (HPI) 04/29/2019 on evaluation today patient actually presents for initial evaluation in our clinic concerning issues that she is having with a wound which is a trauma on the left forearm. This is actually an injury that occurred as a result of being bitten by her dog unfortunately. She was seen in the emergency department where sutures were placed unfortunately not all the sutures took I think there was much more damage internally than was able to be repaired just on a simple suture repair. Nonetheless she does have some slough covering the surface of the wound and she has 2 marks on her arm both of which seem to have some depth to them I believe they likely do connect. Other than this she tells me that she really has been fairly healthy  until just in the past 1-2 months she also dropped a can on her foot and broke 1 of the toes on the left foot that is actually healing she is in a postop shoe for that. She is obviously somewhat frustrated with the situation. 05/06/2019 on evaluation today patient appears to be doing excellent in regard to her left forearm ulcer. She has been tolerating the dressing changes without complication. Fortunately there is no signs of active  infection and in fact a lot of undermining that was previously present during her last evaluation has completely resolved. I am very pleased in this regard. She has had a friend that is been helping her to manage this currently. 05/17/2019 upon evaluation today patient actually appears to be doing well with regard to her left forearm ulcer. She has been tolerating the dressing changes without complication. Fortunately there is no signs of active infection at this time. No fevers, chills, nausea, vomiting, or diarrhea. She is having no discomfort. Patient History Information obtained from Patient. Family History Cancer - Mother,Maternal Grandparents, Diabetes - Maternal Grandparents, Lung Disease - Maternal Grandparents,Siblings, No family history of Heart Disease, Hereditary Spherocytosis, Hypertension, Kidney Disease, Seizures, Stroke, Thyroid Problems, Tuberculosis. Social History Former smoker - 8 years, Marital Status - Married, Alcohol Use - Rarely, Drug Use - No History, Caffeine Use - Daily. Medical History Eyes Patient has history of Cataracts - left eye Denies history of Glaucoma, Optic Neuritis Musculoskeletal Denies history of Gout, Rheumatoid Arthritis, Osteoarthritis Medical And Surgical History Notes Eyes retinoschesis Shelby Ward, Shelby D. ([id number]) Review of Systems (ROS) Constitutional Symptoms (General Health) Denies complaints or symptoms of Fatigue, Fever, Chills, Marked Weight Change. Respiratory Denies complaints or symptoms of Chronic  or frequent coughs, Shortness of Breath. Cardiovascular Denies complaints or symptoms of Chest pain, LE edema. Psychiatric Denies complaints or symptoms of Anxiety, Claustrophobia. Objective Constitutional Well-nourished and well-hydrated in no acute distress. Vitals Time Taken: 12:50 PM, Height: 69 in, Weight: 280 lbs, BMI: 41.3, Temperature: 98.7 F, Pulse: 86 bpm, Respiratory Rate: 16 breaths/min, Blood Pressure: 160/94 mmHg. Respiratory normal breathing without difficulty. Psychiatric this patient is able to make decisions and demonstrates good insight into disease process. Alert and Oriented x 3. pleasant and cooperative. General Notes: Currently patient's wound bed actually showed signs of excellent epithelialization and in fact appears to be completely close she tells me that the dressing material she has been putting on this which was the Prisma is no longer even sticking to it because it is doing so well. Obviously this is because it is healed. Integumentary (Hair, Skin) Wound #1 status is Healed - Epithelialized. Original cause of wound was Bite. The wound is located on the Left Forearm. The wound measures 0cm length x 0cm width x 0cm depth; 0cm^2 area and 0cm^3 volume. There is no tunneling or undermining noted. There is a none present amount of drainage noted. The wound margin is indistinct and nonvisible. There is no granulation within the wound bed. There is a large (67-100%) amount of necrotic tissue within the wound bed including Eschar. Assessment Active Problems ICD-10 Unspecified open wound of left forearm, initial encounter Bitten by dog, initial encounter Non-pressure chronic ulcer of skin of other sites with fat layer exposed Shelby Ward, Shelby D. ([id number]) [facility] - Call with any questions and concerns At this point ready to discontinue wound care services. The patient appears to be completely healed  and there are no other complications or concerns at this time. We will subsequently see where things stand in the future if she needs us for anything else otherwise I am pleased that she healed so quickly this is excellent news. Electronic Signature(s) Signed: 05/17/2019 1:35:55 PM By: Lenda KelpStone III, Hoyt PA-C Entered By: Lenda KelpStone III, Hoyt on 05/17/2019 13:35:55 Shelby Ward, Shelby D. ([id number]) -------------------------------------------------------------------------------- ROS/PFSH Details Patient Name: Shelby Ward, Janean D. Date of Service: 05/17/2019 12:45 PM Medical Record Number: [id number] Patient Account Number: [id number] Date of  Birth/Sex: 02/24/1981 (38 y.o. F) Treating RN: Arnette NorrisBiell, Kristina Primary Care Provider: PATIENT, NO Other Clinician: Referring Provider: Dorothea GlassmanMALINDA, PAUL Treating Provider/Extender: Linwood DibblesSTONE III, HOYT Weeks in Treatment: 2 Information Obtained From Patient Constitutional Symptoms (General Health) Complaints and Symptoms: Negative for: Fatigue; Fever; Chills; Marked Weight Change Respiratory Complaints and Symptoms: Negative for: Chronic or frequent coughs; Shortness of Breath Cardiovascular Complaints and Symptoms: Negative for: Chest pain; LE edema Psychiatric Complaints and Symptoms: Negative for: Anxiety; Claustrophobia Eyes Medical History: Positive for: Cataracts - left eye Negative for: Glaucoma; Optic Neuritis Past Medical History Notes: retinoschesis Musculoskeletal Medical History: Negative for: Gout; Rheumatoid Arthritis; Osteoarthritis HBO Extended History Items Eyes: Cataracts Immunizations Pneumococcal Vaccine: Received Pneumococcal Vaccination: No Implantable Devices None Family and Social History Shelby Ward, Jozie D. ([id number]) Cancer: Yes - Mother,Maternal Grandparents; Diabetes: Yes - Maternal Grandparents; Heart Disease: No; Hereditary Spherocytosis: No; Hypertension: No; Kidney Disease: No; Lung Disease: Yes - Maternal  Grandparents,Siblings; Seizures: No; Stroke: No; Thyroid Problems: No; Tuberculosis: No; Former smoker - 8 years; Marital Status - Married; Alcohol Use: Rarely; Drug Use: No History; Caffeine Use: Daily; Financial Concerns: No; Food, Clothing or Shelter Needs: No; Support System Lacking: No; Transportation Concerns: No Physician Affirmation I have reviewed and agree with the above information. Electronic Signature(s) Signed: 05/17/2019 4:21:56 PM By: Arnette NorrisBiell, Kristina Signed: 05/17/2019 5:48:01 PM By: Lenda KelpStone III, Hoyt PA-C Entered By: Lenda KelpStone III, Hoyt on 05/17/2019 13:35:23 Shelby Ward, Ebunoluwa D. ([id number]) -------------------------------------------------------------------------------- SuperBill Details Patient Name: Shelby Ward, Rori D. Date of Service: 05/17/2019 Medical Record Number: [id number] Patient Account Number: [id number] Date of Birth/Sex: 11/26/1980 (38 y.o. 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 Facility Procedures CPT4 Code: 4132440176100137 Description: [phone] - WOUND CARE VISIT-LEV 2 EST PT Modifier: Quantity: 1 Physician Procedures CPT4 Code: [id number] Description: 99213 - WC PHYS LEVEL 3 - EST PT ICD-10 Diagnosis Description S51.802A Unspecified open wound of left forearm, initial encounter W54.0XXA Bitten by dog, initial encounter [phone] Non-pressure chronic ulcer of skin of other sites with fat l Modifier: ayer exposed Quantity: 1 Electronic Signature(s) Signed: 05/17/2019 1:36:10 PM By: Lenda KelpStone III, Hoyt PA-C Entered By: Lenda KelpStone III, Hoyt on 05/17/2019 13:36:09

## 2019-05-20 ENCOUNTER — Ambulatory Visit: Payer: Self-pay | Admitting: Physician Assistant

## 2020-01-08 ENCOUNTER — Ambulatory Visit: Payer: Self-pay | Attending: Internal Medicine

## 2020-01-08 DIAGNOSIS — Z23 Encounter for immunization: Secondary | ICD-10-CM

## 2020-01-08 NOTE — Progress Notes (Signed)
   Covid-19 Vaccination Clinic  Name:  Shelby Ward    MRN: [id number] DOB: Jan 04, 1981  01/08/2020  [name] was observed post Covid-19 immunization for 15 minutes without incident. She was provided with Vaccine Information Sheet and instruction to access the V-Safe system.   [name] was instructed to call 911 with any severe reactions post vaccine: Marland Kitchen Difficulty breathing  . Swelling of face and throat  . A fast heartbeat  . A bad rash all over body  . Dizziness and weakness   Immunizations Administered    Name Date Dose VIS Date Route   Pfizer COVID-19 Vaccine 01/08/2020 11:42 AM 0.3 mL 09/17/2019 Intramuscular   Manufacturer: [employer], [employer]   Lot: [id number]   NDC: [address]-7

## 2020-02-01 ENCOUNTER — Ambulatory Visit: Payer: Self-pay | Attending: Internal Medicine

## 2020-02-01 DIAGNOSIS — Z23 Encounter for immunization: Secondary | ICD-10-CM

## 2020-02-01 NOTE — Progress Notes (Signed)
   Covid-19 Vaccination Clinic  Name:  ELIORA NIENHUIS    MRN: [id number] DOB: 03-Jul-1981  02/01/2020  Ms. Turbyfill was observed post Covid-19 immunization for 15 minutes without incident. She was provided with Vaccine Information Sheet and instruction to access the V-Safe system.   Ms. Ellinwood was instructed to call 911 with any severe reactions post vaccine: Marland Kitchen Difficulty breathing  . Swelling of face and throat  . A fast heartbeat  . A bad rash all over body  . Dizziness and weakness   Immunizations Administered    Name Date Dose VIS Date Route   Pfizer COVID-19 Vaccine 02/01/2020  2:47 PM 0.3 mL 12/01/2018 Intramuscular   Manufacturer: [employer], [employer]   Lot: [id number]   NDC: [address]-3

## 2020-06-17 ENCOUNTER — Emergency Department
Admission: EM | Admit: 2020-06-17 | Discharge: 2020-06-17 | Disposition: A | Discharge status: Home / Self Care | Payer: Self-pay | Attending: Emergency Medicine | Admitting: Emergency Medicine

## 2020-06-17 ENCOUNTER — Emergency Department: Payer: Self-pay

## 2020-06-17 ENCOUNTER — Other Ambulatory Visit: Payer: Self-pay

## 2020-06-17 DIAGNOSIS — M7918 Myalgia, other site: Secondary | ICD-10-CM

## 2020-06-17 DIAGNOSIS — Y999 Unspecified external cause status: Secondary | ICD-10-CM | POA: Insufficient documentation

## 2020-06-17 DIAGNOSIS — Y92410 Unspecified street and highway as the place of occurrence of the external cause: Secondary | ICD-10-CM | POA: Insufficient documentation

## 2020-06-17 DIAGNOSIS — Z79899 Other long term (current) drug therapy: Secondary | ICD-10-CM | POA: Insufficient documentation

## 2020-06-17 DIAGNOSIS — S161XXA Strain of muscle, fascia and tendon at neck level, initial encounter: Secondary | ICD-10-CM | POA: Insufficient documentation

## 2020-06-17 DIAGNOSIS — Y9389 Activity, other specified: Secondary | ICD-10-CM | POA: Insufficient documentation

## 2020-06-17 MED ORDER — TRAMADOL HCL 50 MG PO TABS
50.0000 mg | ORAL_TABLET | Freq: Four times a day (QID) | ORAL | 0 refills | Status: AC | PRN
Start: 2020-06-17 — End: ?

## 2020-06-17 MED ORDER — OXYCODONE-ACETAMINOPHEN 7.5-325 MG PO TABS: 1.0000 | ORAL_TABLET | Freq: Once | ORAL | Status: DC

## 2020-06-17 MED ORDER — NAPROXEN 500 MG PO TABS: 500.0000 mg | ORAL_TABLET | Freq: Two times a day (BID) | ORAL | 0 refills | Status: AC

## 2020-06-17 MED ORDER — METHOCARBAMOL 500 MG PO TABS: 500.0000 mg | ORAL_TABLET | Freq: Four times a day (QID) | ORAL | 0 refills | Status: AC | PRN

## 2020-06-17 MED ORDER — TETANUS-DIPHTH-ACELL PERTUSSIS 5-2.5-18.5 LF-MCG/0.5 IM SUSP: 0.5000 mL | Freq: Once | INTRAMUSCULAR | Status: DC

## 2020-06-17 MED ORDER — LIDOCAINE-PRILOCAINE 2.5-2.5 % EX CREA: TOPICAL_CREAM | Freq: Once | CUTANEOUS | Status: DC
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
## 2020-06-17 NOTE — Discharge Instructions (Signed)
Follow-up with your primary care provider if any continued problems or concerns or you may follow-up with [facility] acute care walk-in clinic.  Begin taking Robaxin which is the muscle relaxant as directed.  This medication could cause drowsiness and increase your risk for injury.  The naproxen you may take with food twice a day and if additional pain medication is needed you may take Tylenol with this medication.  Tramadol is every 6 hours for severe pain.  You may use ice or heat to your muscles as needed for discomfort.

## 2020-06-28 ENCOUNTER — Emergency Department: Payer: No Typology Code available for payment source

## 2020-06-28 ENCOUNTER — Encounter: Payer: Self-pay | Admitting: Emergency Medicine

## 2020-06-28 ENCOUNTER — Emergency Department
Admission: EM | Admit: 2020-06-28 | Discharge: 2020-06-28 | Disposition: A | Discharge status: Home / Self Care | Payer: No Typology Code available for payment source | Attending: Emergency Medicine | Admitting: Emergency Medicine

## 2020-06-28 ENCOUNTER — Other Ambulatory Visit: Payer: Self-pay

## 2020-06-28 DIAGNOSIS — S161XXA Strain of muscle, fascia and tendon at neck level, initial encounter: Secondary | ICD-10-CM | POA: Diagnosis not present

## 2020-06-28 DIAGNOSIS — M542 Cervicalgia: Secondary | ICD-10-CM | POA: Diagnosis present

## 2020-06-28 DIAGNOSIS — S161XXD Strain of muscle, fascia and tendon at neck level, subsequent encounter: Secondary | ICD-10-CM

## 2020-06-28 MED ORDER — CYCLOBENZAPRINE HCL 10 MG PO TABS: 10.0000 mg | ORAL_TABLET | Freq: Three times a day (TID) | ORAL | 0 refills | Status: AC | PRN

## 2020-06-28 MED ORDER — ORPHENADRINE CITRATE 30 MG/ML IJ SOLN
60.0000 mg | Freq: Once | INTRAMUSCULAR | Status: AC
  Administered 2020-06-28: 60 mg via INTRAMUSCULAR
  Filled 2020-06-28: qty 2

## 2020-06-28 MED ORDER — MELOXICAM 15 MG PO TABS: 15.0000 mg | ORAL_TABLET | Freq: Every day | ORAL | 0 refills | Status: AC

## 2020-06-28 MED ORDER — KETOROLAC TROMETHAMINE 30 MG/ML IJ SOLN
30.0000 mg | Freq: Once | INTRAMUSCULAR | Status: AC
  Administered 2020-06-28: 30 mg via INTRAMUSCULAR
  Filled 2020-06-28: qty 1

## 2020-06-28 MED ORDER — OXYCODONE-ACETAMINOPHEN 5-325 MG PO TABS
1.0000 | ORAL_TABLET | Freq: Once | ORAL | Status: AC
  Administered 2020-06-28: 1 via ORAL
  Filled 2020-06-28: qty 1

## 2020-06-28 MED ORDER — PREDNISONE 50 MG PO TABS: 50.0000 mg | ORAL_TABLET | Freq: Every day | ORAL | 0 refills | Status: AC
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
